# Patient Record
Sex: Female | Born: 1970 | Race: White | Hispanic: No | Marital: Single
Health system: Southern US, Community
[De-identification: ages and names within clinical notes are randomized; demographics above are authoritative.]

## PROBLEM LIST (undated history)

## (undated) DIAGNOSIS — I509 Heart failure, unspecified: Secondary | ICD-10-CM

## (undated) DIAGNOSIS — J45909 Unspecified asthma, uncomplicated: Secondary | ICD-10-CM

## (undated) DIAGNOSIS — I2699 Other pulmonary embolism without acute cor pulmonale: Secondary | ICD-10-CM

## (undated) DIAGNOSIS — I272 Pulmonary hypertension, unspecified: Secondary | ICD-10-CM

---

## 2021-07-31 ENCOUNTER — Inpatient Hospital Stay (HOSPITAL_COMMUNITY)
Admit: 2021-07-31 | Discharge: 2021-07-31 | Disposition: A | Discharge status: Home / Self Care | Payer: PRIVATE HEALTH INSURANCE | Attending: Internal Medicine | Admitting: Internal Medicine

## 2021-07-31 ENCOUNTER — Observation Stay
Admission: EM | Admit: 2021-07-31 | Discharge: 2021-08-01 | Disposition: A | Discharge status: Skilled Nursing Facility | Payer: PRIVATE HEALTH INSURANCE | Attending: Obstetrics and Gynecology | Admitting: Obstetrics and Gynecology

## 2021-07-31 ENCOUNTER — Other Ambulatory Visit: Payer: Self-pay

## 2021-07-31 ENCOUNTER — Inpatient Hospital Stay: Payer: PRIVATE HEALTH INSURANCE

## 2021-07-31 ENCOUNTER — Emergency Department: Payer: PRIVATE HEALTH INSURANCE

## 2021-07-31 ENCOUNTER — Encounter: Payer: Self-pay | Admitting: Emergency Medicine

## 2021-07-31 DIAGNOSIS — I2699 Other pulmonary embolism without acute cor pulmonale: Secondary | ICD-10-CM | POA: Diagnosis not present

## 2021-07-31 DIAGNOSIS — Z8673 Personal history of transient ischemic attack (TIA), and cerebral infarction without residual deficits: Secondary | ICD-10-CM

## 2021-07-31 DIAGNOSIS — R0602 Shortness of breath: Secondary | ICD-10-CM | POA: Diagnosis not present

## 2021-07-31 DIAGNOSIS — Z79899 Other long term (current) drug therapy: Secondary | ICD-10-CM | POA: Insufficient documentation

## 2021-07-31 DIAGNOSIS — Z86711 Personal history of pulmonary embolism: Secondary | ICD-10-CM | POA: Diagnosis not present

## 2021-07-31 DIAGNOSIS — I700 Atherosclerosis of aorta: Secondary | ICD-10-CM | POA: Insufficient documentation

## 2021-07-31 DIAGNOSIS — J810 Acute pulmonary edema: Secondary | ICD-10-CM | POA: Diagnosis not present

## 2021-07-31 DIAGNOSIS — F32A Depression, unspecified: Secondary | ICD-10-CM | POA: Diagnosis present

## 2021-07-31 DIAGNOSIS — I509 Heart failure, unspecified: Secondary | ICD-10-CM

## 2021-07-31 DIAGNOSIS — I5031 Acute diastolic (congestive) heart failure: Secondary | ICD-10-CM | POA: Insufficient documentation

## 2021-07-31 DIAGNOSIS — Z7982 Long term (current) use of aspirin: Secondary | ICD-10-CM | POA: Insufficient documentation

## 2021-07-31 DIAGNOSIS — J45901 Unspecified asthma with (acute) exacerbation: Secondary | ICD-10-CM | POA: Diagnosis not present

## 2021-07-31 DIAGNOSIS — Z20822 Contact with and (suspected) exposure to covid-19: Secondary | ICD-10-CM | POA: Diagnosis not present

## 2021-07-31 DIAGNOSIS — J9620 Acute and chronic respiratory failure, unspecified whether with hypoxia or hypercapnia: Secondary | ICD-10-CM | POA: Diagnosis not present

## 2021-07-31 DIAGNOSIS — I272 Pulmonary hypertension, unspecified: Secondary | ICD-10-CM | POA: Diagnosis present

## 2021-07-31 DIAGNOSIS — J9621 Acute and chronic respiratory failure with hypoxia: Principal | ICD-10-CM | POA: Insufficient documentation

## 2021-07-31 DIAGNOSIS — I6930 Unspecified sequelae of cerebral infarction: Secondary | ICD-10-CM

## 2021-07-31 HISTORY — DX: Other pulmonary embolism without acute cor pulmonale: I26.99

## 2021-07-31 HISTORY — DX: Morbid (severe) obesity due to excess calories: E66.01

## 2021-07-31 HISTORY — DX: Pulmonary hypertension, unspecified: I27.20

## 2021-07-31 LAB — BASIC METABOLIC PANEL
Anion gap: 7 (ref 5–15)
BUN: 21 mg/dL — ABNORMAL HIGH (ref 6–20)
CO2: 43 mmol/L — ABNORMAL HIGH (ref 22–32)
Calcium: 8.9 mg/dL (ref 8.9–10.3)
Chloride: 91 mmol/L — ABNORMAL LOW (ref 98–111)
Creatinine, Ser: <0.3 mg/dL — ABNORMAL LOW (ref 0.44–1.00)
Glucose, Bld: 100 mg/dL — ABNORMAL HIGH (ref 70–99)
Potassium: UNDETERMINED mmol/L (ref 3.5–5.1)
Sodium: 141 mmol/L (ref 135–145)

## 2021-07-31 LAB — CBC WITH DIFFERENTIAL/PLATELET
Abs Immature Granulocytes: 0.17 10*3/uL — ABNORMAL HIGH (ref 0.00–0.07)
Basophils Absolute: 0 10*3/uL (ref 0.0–0.1)
Basophils Relative: 1 %
Eosinophils Absolute: 0.1 10*3/uL (ref 0.0–0.5)
Eosinophils Relative: 1 %
HCT: 40.4 % (ref 36.0–46.0)
Hemoglobin: 11.5 g/dL — ABNORMAL LOW (ref 12.0–15.0)
Immature Granulocytes: 4 %
Lymphocytes Relative: 34 %
Lymphs Abs: 1.5 10*3/uL (ref 0.7–4.0)
MCH: 26.9 pg (ref 26.0–34.0)
MCHC: 28.5 g/dL — ABNORMAL LOW (ref 30.0–36.0)
MCV: 94.6 fL (ref 80.0–100.0)
Monocytes Absolute: 0.4 10*3/uL (ref 0.1–1.0)
Monocytes Relative: 9 %
Neutro Abs: 2.3 10*3/uL (ref 1.7–7.7)
Neutrophils Relative %: 51 %
Platelets: 121 10*3/uL — ABNORMAL LOW (ref 150–400)
RBC: 4.27 MIL/uL (ref 3.87–5.11)
RDW: 15.3 % (ref 11.5–15.5)
WBC: 4.5 10*3/uL (ref 4.0–10.5)
nRBC: 0 % (ref 0.0–0.2)

## 2021-07-31 LAB — ECHOCARDIOGRAM COMPLETE
AR max vel: 2.36 cm2
AV Area VTI: 2.62 cm2
AV Area mean vel: 2.5 cm2
AV Mean grad: 4 mmHg
AV Peak grad: 8.2 mmHg
Ao pk vel: 1.43 m/s
Area-P 1/2: 10.54 cm2
Height: 63 in
MV VTI: 2.8 cm2
S' Lateral: 3.5 cm
Weight: 3200 oz

## 2021-07-31 LAB — RESP PANEL BY RT-PCR (FLU A&B, COVID) ARPGX2
Influenza A by PCR: NEGATIVE
Influenza B by PCR: NEGATIVE
SARS Coronavirus 2 by RT PCR: NEGATIVE

## 2021-07-31 LAB — HEMOGLOBIN A1C
Hgb A1c MFr Bld: 4.8 % (ref 4.8–5.6)
Mean Plasma Glucose: 91.06 mg/dL

## 2021-07-31 LAB — TROPONIN I (HIGH SENSITIVITY)
Troponin I (High Sensitivity): 9 ng/L (ref <18)
Troponin I (High Sensitivity): 8 ng/L (ref <18)

## 2021-07-31 LAB — BRAIN NATRIURETIC PEPTIDE: B Natriuretic Peptide: 44.9 pg/mL (ref 0.0–100.0)

## 2021-07-31 LAB — MRSA NEXT GEN BY PCR, NASAL: MRSA by PCR Next Gen: DETECTED — AB

## 2021-07-31 LAB — VALPROIC ACID LEVEL: Valproic Acid Lvl: 67 ug/mL (ref 50.0–100.0)

## 2021-07-31 LAB — D-DIMER, QUANTITATIVE: D-Dimer, Quant: 0.28 ug/mL-FEU (ref 0.00–0.50)

## 2021-07-31 MED ORDER — ENOXAPARIN SODIUM 40 MG/0.4ML IJ SOSY: 40.0000 mg | PREFILLED_SYRINGE | INTRAMUSCULAR | Status: DC

## 2021-07-31 MED ORDER — ACETAMINOPHEN 650 MG RE SUPP: 650.0000 mg | Freq: Four times a day (QID) | RECTAL | Status: DC | PRN

## 2021-07-31 MED ORDER — FENTANYL CITRATE PF 50 MCG/ML IJ SOSY
50.0000 ug | PREFILLED_SYRINGE | Freq: Once | INTRAMUSCULAR | Status: AC
  Administered 2021-07-31: 50 ug via INTRAVENOUS
  Filled 2021-07-31: qty 1

## 2021-07-31 MED ORDER — ENOXAPARIN SODIUM 60 MG/0.6ML IJ SOSY
50.0000 mg | PREFILLED_SYRINGE | Freq: Two times a day (BID) | INTRAMUSCULAR | Status: DC
Start: 2021-07-31 — End: 2021-07-31

## 2021-07-31 MED ORDER — FUROSEMIDE 10 MG/ML IJ SOLN
40.0000 mg | Freq: Every day | INTRAMUSCULAR | Status: DC
  Administered 2021-08-01: 40 mg via INTRAVENOUS
  Filled 2021-07-31: qty 4

## 2021-07-31 MED ORDER — METHYLPREDNISOLONE SODIUM SUCC 125 MG IJ SOLR
125.0000 mg | Freq: Once | INTRAMUSCULAR | Status: AC
  Administered 2021-07-31: 125 mg via INTRAVENOUS
  Filled 2021-07-31: qty 2

## 2021-07-31 MED ORDER — NALOXONE HCL 2 MG/2ML IJ SOSY
0.4000 mg | PREFILLED_SYRINGE | Freq: Once | INTRAMUSCULAR | Status: DC
Start: 2021-07-31 — End: 2021-08-01

## 2021-07-31 MED ORDER — DOCUSATE SODIUM 100 MG PO CAPS
100.0000 mg | ORAL_CAPSULE | Freq: Every day | ORAL | Status: DC
Start: 2021-07-31 — End: 2021-08-01
  Administered 2021-07-31 – 2021-08-01 (×2): 100 mg via ORAL
  Filled 2021-07-31 (×2): qty 1

## 2021-07-31 MED ORDER — LIDOCAINE 5 % EX PTCH
1.0000 | MEDICATED_PATCH | CUTANEOUS | Status: DC
  Administered 2021-07-31: 1 via TRANSDERMAL
  Filled 2021-07-31 (×2): qty 1

## 2021-07-31 MED ORDER — RISPERIDONE 0.5 MG PO TABS
0.5000 mg | ORAL_TABLET | ORAL | Status: DC
  Administered 2021-07-31 – 2021-08-01 (×2): 0.5 mg via ORAL
  Filled 2021-07-31 (×2): qty 1

## 2021-07-31 MED ORDER — ONDANSETRON HCL 4 MG PO TABS: 4.0000 mg | ORAL_TABLET | Freq: Four times a day (QID) | ORAL | Status: DC | PRN

## 2021-07-31 MED ORDER — ATORVASTATIN CALCIUM 20 MG PO TABS
40.0000 mg | ORAL_TABLET | Freq: Every day | ORAL | Status: DC
Start: 2021-07-31 — End: 2021-08-01
  Administered 2021-07-31: 40 mg via ORAL
  Filled 2021-07-31: qty 2

## 2021-07-31 MED ORDER — MUPIROCIN 2 % EX OINT
1.0000 "application " | TOPICAL_OINTMENT | Freq: Two times a day (BID) | CUTANEOUS | Status: DC
  Administered 2021-07-31 – 2021-08-01 (×2): 1 via NASAL
  Filled 2021-07-31: qty 22

## 2021-07-31 MED ORDER — ENOXAPARIN SODIUM 60 MG/0.6ML IJ SOSY
60.0000 mg | PREFILLED_SYRINGE | Freq: Two times a day (BID) | INTRAMUSCULAR | Status: DC
  Administered 2021-07-31 – 2021-08-01 (×2): 60 mg via SUBCUTANEOUS
  Filled 2021-07-31: qty 0.6

## 2021-07-31 MED ORDER — POLYETHYLENE GLYCOL 3350 17 G PO PACK: 17.0000 g | PACK | Freq: Every day | ORAL | Status: DC | PRN

## 2021-07-31 MED ORDER — ATORVASTATIN CALCIUM 20 MG PO TABS
40.0000 mg | ORAL_TABLET | Freq: Every day | ORAL | Status: DC
  Administered 2021-07-31: 40 mg via ORAL
  Filled 2021-07-31: qty 2

## 2021-07-31 MED ORDER — CALCIUM CARBONATE ANTACID 500 MG PO CHEW
1.0000 | CHEWABLE_TABLET | ORAL | Status: DC
  Administered 2021-07-31 – 2021-08-01 (×2): 200 mg via ORAL
  Filled 2021-07-31 (×2): qty 1

## 2021-07-31 MED ORDER — FUROSEMIDE 10 MG/ML IJ SOLN
40.0000 mg | Freq: Once | INTRAMUSCULAR | Status: AC
Start: 2021-07-31 — End: 2021-07-31
  Administered 2021-07-31: 40 mg via INTRAVENOUS
  Filled 2021-07-31: qty 4

## 2021-07-31 MED ORDER — ONDANSETRON HCL 4 MG/2ML IJ SOLN
4.0000 mg | Freq: Four times a day (QID) | INTRAMUSCULAR | Status: DC | PRN
  Administered 2021-07-31: 4 mg via INTRAVENOUS
  Filled 2021-07-31: qty 2

## 2021-07-31 MED ORDER — CHLORHEXIDINE GLUCONATE CLOTH 2 % EX PADS
6.0000 | MEDICATED_PAD | Freq: Every day | CUTANEOUS | Status: DC
  Administered 2021-08-01: 6 via TOPICAL

## 2021-07-31 MED ORDER — IPRATROPIUM-ALBUTEROL 0.5-2.5 (3) MG/3ML IN SOLN: 3.0000 mL | Freq: Two times a day (BID) | RESPIRATORY_TRACT | Status: DC | PRN

## 2021-07-31 MED ORDER — PANTOPRAZOLE SODIUM 20 MG PO TBEC
20.0000 mg | DELAYED_RELEASE_TABLET | Freq: Every day | ORAL | Status: DC
  Administered 2021-07-31 – 2021-08-01 (×2): 20 mg via ORAL
  Filled 2021-07-31 (×2): qty 1

## 2021-07-31 MED ORDER — POLYVINYL ALCOHOL 1.4 % OP SOLN: 1.0000 [drp] | Freq: Two times a day (BID) | OPHTHALMIC | Status: DC

## 2021-07-31 MED ORDER — PREGABALIN 50 MG PO CAPS
50.0000 mg | ORAL_CAPSULE | ORAL | Status: DC
  Administered 2021-07-31 – 2021-08-01 (×2): 50 mg via ORAL
  Filled 2021-07-31 (×2): qty 1

## 2021-07-31 MED ORDER — TRAMADOL HCL 50 MG PO TABS
50.0000 mg | ORAL_TABLET | Freq: Four times a day (QID) | ORAL | Status: DC | PRN
  Administered 2021-07-31 – 2021-08-01 (×2): 50 mg via ORAL
  Filled 2021-07-31 (×2): qty 1

## 2021-07-31 MED ORDER — SERTRALINE HCL 50 MG PO TABS
100.0000 mg | ORAL_TABLET | Freq: Every day | ORAL | Status: DC
  Administered 2021-07-31 – 2021-08-01 (×2): 100 mg via ORAL
  Filled 2021-07-31 (×2): qty 2

## 2021-07-31 MED ORDER — DIVALPROEX SODIUM 500 MG PO DR TAB
500.0000 mg | DELAYED_RELEASE_TABLET | ORAL | Status: DC
  Administered 2021-07-31 – 2021-08-01 (×2): 500 mg via ORAL
  Filled 2021-07-31 (×2): qty 1

## 2021-07-31 MED ORDER — ASPIRIN 81 MG PO CHEW: 81.0000 mg | CHEWABLE_TABLET | Freq: Every day | ORAL | Status: DC

## 2021-07-31 MED ORDER — ASPIRIN 81 MG PO CHEW
81.0000 mg | CHEWABLE_TABLET | Freq: Every day | ORAL | Status: DC
  Administered 2021-07-31 – 2021-08-01 (×2): 81 mg via ORAL
  Filled 2021-07-31 (×2): qty 1

## 2021-07-31 MED ORDER — DIVALPROEX SODIUM 125 MG PO CSDR
250.0000 mg | DELAYED_RELEASE_CAPSULE | Freq: Every day | ORAL | Status: DC
  Administered 2021-07-31: 250 mg via ORAL
  Filled 2021-07-31 (×2): qty 2

## 2021-07-31 MED ORDER — PERFLUTREN LIPID MICROSPHERE
1.0000 mL | INTRAVENOUS | Status: AC | PRN
  Administered 2021-07-31: 2 mL via INTRAVENOUS
  Filled 2021-07-31: qty 10

## 2021-07-31 MED ORDER — ACETAMINOPHEN 325 MG PO TABS: 650.0000 mg | ORAL_TABLET | Freq: Four times a day (QID) | ORAL | Status: DC | PRN

## 2021-07-31 MED ORDER — ALBUTEROL SULFATE (2.5 MG/3ML) 0.083% IN NEBU
5.0000 mg | INHALATION_SOLUTION | Freq: Once | RESPIRATORY_TRACT | Status: AC
  Administered 2021-07-31: 5 mg via RESPIRATORY_TRACT
  Filled 2021-07-31: qty 6

## 2021-07-31 NOTE — Consult Note (Signed)
ANTICOAGULATION CONSULT NOTE - Initial Consult ? ?Pharmacy Consult for enoxaparin ?Indication:  Hx of PE- antiphospholipid syndrome ? ?Allergies  ?Allergen Reactions  ? Sulphamethoxydiazine Diarrhea  ? Tetracyclines & Related Diarrhea  ? ? ?Patient Measurements: ?Height: 5\' 3"  (160 cm) ?Weight: 90.7 kg (200 lb) ?IBW/kg (Calculated) : 52.4 ? ? ?Vital Signs: ?Temp: 97.7 ?F (36.5 ?C) (04/26 0525) ?Temp Source: Oral (04/26 0525) ?BP: 104/77 (04/26 1300) ?Pulse Rate: 107 (04/26 1224) ? ?Labs: ?Recent Labs  ?  07/31/21 ?08/02/21 07/31/21 ?0730  ?HGB 11.5*  --   ?HCT 40.4  --   ?PLT 121*  --   ?CREATININE <0.30*  --   ?TROPONINIHS 9 8  ? ? ?CrCl cannot be calculated (This lab value cannot be used to calculate CrCl because it is not a number: <0.30). ? ? ?Medical History: ?Past Medical History:  ?Diagnosis Date  ? Morbid obesity (HCC)   ? Pulmonary emboli (HCC)   ? Pulmonary hypertension (HCC)   ? ? ?Medications:  ?PTA: Enoxaparin regimen 60 mg Q12H SubQ: last dose 4/25 PM ? ?Assessment: ?51 year old female with PMH significant for morbid obesity (BMI 35.4), pulmonary embolism on chronic anticoagulation therapy with Lovenox 60 mg twice daily PTA presented to [facility] 07/31/21 with SOB. Pharmacy consulted to manage therapeutic enoxaparin while admitted. ? ?Attempted to call SNF to verify patient enoxaparin regimen as 60 mg Q12H is less than typical weight based dosing. Unable to determine if patient had previous levels outpatient and had enoxaparin adjusted accordingly. Assuming no missed doses PTA, patient should be at steady state. ? ?Goal of Therapy:  ?Anti-Xa level 0.6-1 units/ml 4hrs after LMWH dose given ?Monitor platelets by anticoagulation protocol: Yes ?  ?Plan:  ?Continue home regimen of 60 mg SQ Q12H, assuming no missed doses patient should already be at steady state.  ?Will check peak Anti-Xa level tomorrow following AM dose and adjust as necessary  ?CBC at least every 72 hours ? ?08/02/21, PharmD, BCPS ?Clinical  Pharmacist   ?07/31/2021,4:05 PM ? ? ?

## 2021-07-31 NOTE — Assessment & Plan Note (Signed)
Treatment as outlined in 2 

## 2021-07-31 NOTE — Progress Notes (Signed)
Admission profile udpated ?

## 2021-07-31 NOTE — ED Notes (Signed)
ED Provider at bedside. 

## 2021-07-31 NOTE — Assessment & Plan Note (Addendum)
Patient presents for evaluation of worsening shortness of breath from her baseline. ?She has chronic respiratory failure and usually wears 4 L of oxygen.  She is currently on high flow nasal cannula ?Worsening respiratory status this to be secondary to pulmonary venous congestion ?We will attempt to wean patient down to her baseline home oxygen once acute illness improves. ?

## 2021-07-31 NOTE — H&P (Addendum)
?History and Physical  ? ? ?Patient: Sharon Black ZLD:[id number] DOB: 07/02/1970 ?DOA: 07/31/2021 ?DOS: the patient was seen and examined on 07/31/2021 ?PCP: Sherol Dade, DO  ?Patient coming from: Home ? ?Chief Complaint:  ?Chief Complaint  ?Patient presents with  ? Shortness of Breath  ? ?HPI: Sharon Black is a 51 y.o. female with medical history significant for morbid obesity, history of CVA with left-sided hemiparesis, chronic respiratory failure on 4 L of oxygen, history of pulmonary embolism on chronic anticoagulation therapy with Lovenox 50 mg twice daily and pulmonary hypertension who was sent to the ER from [facility] for evaluation of shortness of breath. ?At baseline patient wears 4 L of oxygen and per nursing home staff she had episodes of hypoxia with pulse oximetry dropping in the low 80s despite her being on her regular oxygen supplementation. ?She denies having any fever or chills, she denies having any chest pain, no cough, no abdominal pain, no changes in her bowel habits, no dizziness, no lightheadedness, no new focal deficits or blurred vision. ?She has chronic left leg pain which is unchanged. ?Patient received a dose of IV Lasix in the ER as well as a nebulizer treatment.  She was given a dose of fentanyl for her chronic left leg pain and had worsening hypoxia that improved with repositioning. ? ?Review of Systems: As mentioned in the history of present illness. All other systems reviewed and are negative. ?Past Medical History:  ?Diagnosis Date  ? Morbid obesity (HCC)   ? Pulmonary emboli (HCC)   ? Pulmonary hypertension (HCC)   ? ?History reviewed. No pertinent surgical history. ?Social History:  has no history on file for tobacco use, alcohol use, and drug use. ? ?Allergies  ?Allergen Reactions  ? Sulphamethoxydiazine Diarrhea  ? Tetracyclines & Related Diarrhea  ? ? ?History reviewed. No pertinent family history. ? ?Prior to Admission medications   ?Not on File   ? ? ?Physical Exam: ?Physical Exam ?Vitals and nursing note reviewed.  ?Constitutional:   ?   Appearance: She is obese.  ?   Comments: Chronically ill-appearing.  Dry mucous membranes, poor oral hygiene  ?HENT:  ?   Head: Normocephalic and atraumatic.  ?   Mouth/Throat:  ?   Comments: Dry mucous membranes ?Eyes:  ?   Extraocular Movements: Extraocular movements intact.  ?   Pupils: Pupils are equal, round, and reactive to light.  ?Cardiovascular:  ?   Rate and Rhythm: Normal rate and regular rhythm.  ?Pulmonary:  ?   Effort: Pulmonary effort is normal.  ?   Breath sounds: Normal breath sounds.  ?Abdominal:  ?   General: Bowel sounds are normal.  ?   Palpations: Abdomen is soft.  ?   Comments: Central adiposity  ?Musculoskeletal:  ?   Cervical back: Normal range of motion and neck supple.  ?   Right lower leg: Edema present.  ?   Comments: Decreased range of motion left leg.  Contracted  ?Skin: ?   General: Skin is warm and dry.  ?Neurological:  ?   Mental Status: She is alert.  ?   Comments: Left-sided hemiparesis from prior stroke  ?Psychiatric:     ?   Mood and Affect: Mood normal.     ?   Behavior: Behavior normal.  ? ? ? ? ?Data Reviewed: ?Data Reviewed: ?Relevant notes from primary care and specialist visits, past discharge summaries as available in EHR, including Care Everywhere. ?Prior diagnostic testing as pertinent to current admission  diagnoses ?Updated medications and problem lists for reconciliation ?ED course, including vitals, labs, imaging, treatment and response to treatment ?Triage notes, nursing and pharmacy notes and ED provider's notes ?Notable results as noted in HPI ?Labs reviewed.  Sodium 141, chloride 91, bicarb 23, glucose 100, BUN 21, creatinine 0.30 calcium 8.9, BNP 44.9, D-dimer 0.28, white count 4.5, hemoglobin 11.5, hematocrit 40.4, RDW 15.3, platelet count 121 ?Respiratory viral panel is negative ?Chest x-ray reviewed by me shows low lung volumes with imaging findings concerning for  congestive ?heart failure. Severe elevation of the right hemidiaphragm. ?CT scan of the chest without contrast shows no evidence of pneumonia. Suspect pulmonary venous congestion, primarily at the apices. Concurrent hypoventilation with areas of air trapping. Cardiomegaly. ?4. Pulmonary artery enlargement suggests pulmonary arterial hypertension. ?Twelve-lead EKG reviewed by me shows sinus tachycardia with low voltage QRS. ?There are no new results to review at this time. ? ?Assessment and Plan: ?* Acute on chronic respiratory failure (HCC) ?Patient presents for evaluation of worsening shortness of breath from her baseline. ?She has chronic respiratory failure and usually wears 4 L of oxygen.  She is currently on high flow nasal cannula ?Worsening respiratory status this to be secondary to pulmonary venous congestion ?We will attempt to wean patient down to her baseline home oxygen once acute illness improves. ? ?Acute CHF (congestive heart failure) (HCC) ?Unclear etiology ?Most likely diastolic dysfunction as patient has a known history of pulmonary hypertension ?Continue Lasix 40 mg IV daily ?Patient not on a beta-blocker or ACE due to relative hypotension ?Obtain 2D echocardiogram to assess LVEF ?We will consult cardiology ? ?Pulmonary hypertension (HCC) ?Treatment as outlined in 2 ? ?Pulmonary emboli (HCC) ?Patient has a history of pulmonary embolism ?Continue Lovenox 60 mg subcu every 12 hours ? ?Morbid obesity (HCC) ?BMI 35.43 kg/m2 ?Complicates prognosis and care ? ?History of CVA with residual deficit ?Patient has a history of CVA with left-sided hemiparesis ?At baseline she is bed bound ?She will need frequent turning to prevent development of pressure ulcers ?Continue aspirin and atorvastatin ? ?Depression ??? Bipolar Disorder ?Stable ?Continue sertraline, Depakote and risperidone ? ? ? ? ? Advance Care Planning:   Code Status: Full Code  ? ?Consults: Cardiology ? ?Family Communication: Greater than 50% of  time was spent discussing patient's condition and plan of care with her at the bedside.  All questions and concerns have been addressed.  She verbalizes understanding and agrees with the plan. ? ?Severity of Illness: ?The appropriate patient status for this patient is INPATIENT. Inpatient status is judged to be reasonable and necessary in order to provide the required intensity of service to ensure the patient's safety. The patient's presenting symptoms, physical exam findings, and initial radiographic and laboratory data in the context of their chronic comorbidities is felt to place them at high risk for further clinical deterioration. Furthermore, it is not anticipated that the patient will be medically stable for discharge from the hospital within 2 midnights of admission.  ? ?* I certify that at the point of admission it is my clinical judgment that the patient will require inpatient hospital care spanning beyond 2 midnights from the point of admission due to high intensity of service, high risk for further deterioration and high frequency of surveillance required.* ? ?Author: ?Lucile Shutters, MD ?07/31/2021 1:54 PM ? ?For on call review [email or web address].  ?

## 2021-07-31 NOTE — Progress Notes (Addendum)
PT Cancellation Note ? ?Patient Details ?Name: Sharon Black ?MRN: [id number] ?DOB: 08/28/1970 ? ? ?Cancelled Treatment:    Reason Eval/Treat Not Completed: Other (comment)Consult received and chart reviewed. Per discussion with OT, pt is currently bedbound at baseline and reports she does not want/need additional PT at this time. Will cancel current order. ? ? ?Shloma Roggenkamp ?07/31/2021, 1:32 PM ?Elizabeth Palau, PT, DPT, GCS ?[phone] ? ?

## 2021-07-31 NOTE — Assessment & Plan Note (Signed)
BMI 35.43 kg/m2 ?Complicates prognosis and care ?

## 2021-07-31 NOTE — Evaluation (Signed)
Occupational Therapy Evaluation ?Patient Details ?Name: Sharon Black ?MRN: [id number] ?DOB: Dec 17, 1970 ?Today's Date: 07/31/2021 ? ? ?History of Present Illness Sharon Black is a 51 y.o. female with medical history significant for morbid obesity, history of CVA with left-sided hemiparesis, chronic respiratory failure on 4 L of oxygen, history of pulmonary embolism on chronic anticoagulation therapy and pulmonary hypertension who was sent to the ER from [facility] for evaluation of shortness of breath.  ? ?Clinical Impression ?  ?Ms Creech was seen for OT evaluation this date, pt is very pleasant and answers all questions appropriately. Prior to hospital admission, pt was bed bound baseline and is resident at [facility]. Pt reports baseline can adjust trunk with use of trapeze but requires +2 all bed mobility. Reprts she does not hoyer OOB at baseline 2/2 not tolerating sitting. LUE/LLE flaccid at baseline.  ? ?Pt requires TOTAL A x2 L rolling at bed level for periaccess and doff brief. Washcloth placed in L hand to prevent skin breakdown. Pt reports at baseline functional independence and good understanding of positioning for skin prevention. Will sign off, pt aware and agreeable. Recommend LTC at d/c.    ? ?Recommendations for follow up therapy are one component of a multi-disciplinary discharge planning process, led by the attending physician.  Recommendations may be updated based on patient status, additional functional criteria and insurance authorization.  ? ?Follow Up Recommendations ? Long-term institutional care without follow-up therapy  ?  ?Assistance Recommended at Discharge Frequent or constant Supervision/Assistance  ?Patient can return home with the following Other (comment) (+3 people or lift equipment) ? ?  ?Functional Status Assessment ? Patient has not had a recent decline in their functional status  ?Equipment Recommendations ? Hospital bed  ?  ?Recommendations for Other Services    ? ? ?  ?Precautions / Restrictions Precautions ?Precautions: Fall ?Restrictions ?Weight Bearing Restrictions: No  ? ?  ? ?Mobility Bed Mobility ?Overal bed mobility: Needs Assistance ?Bed Mobility: Rolling ?Rolling: Total assist, +2 for physical assistance ?  ?  ?  ?  ?General bed mobility comments: TOTAL A x2 L rolling - limited by size of stretcher ?  ? ?Transfers ?  ?  ?  ?  ?  ?  ?  ?  ?  ?General transfer comment: bed bound baseline ?  ? ?  ?   ? ?ADL either performed or assessed with clinical judgement  ? ?ADL Overall ADL's : Needs assistance/impaired ?  ?  ?  ?  ?  ?  ?  ?  ?  ?  ?  ?  ?  ?  ?  ?  ?  ?  ?  ?General ADL Comments: TOTAL A x2 bed level toileting and doff brief. SETUP self-drinking at bed level.  ? ? ? ? ?Pertinent Vitals/Pain Pain Assessment ?Pain Assessment: No/denies pain  ? ? ? ?Hand Dominance Right ?  ?Extremity/Trunk Assessment Upper Extremity Assessment ?Upper Extremity Assessment: LUE deficits/detail ?LUE Deficits / Details: LUE flaccid baseline ?  ?Lower Extremity Assessment ?Lower Extremity Assessment: LLE deficits/detail ?LLE Deficits / Details: near flaccid baseline - reports hx of patellar fx ?  ?  ?  ?Communication Communication ?Communication: No difficulties ?  ?Cognition Arousal/Alertness: Awake/alert ?Behavior During Therapy: [facility] for tasks assessed/performed ?Overall Cognitive Status: Within Functional Limits for tasks assessed ?  ?  ?  ?  ?  ?  ?  ?  ?  ?  ?  ?  ?  ?  ?  ?  ?  ?  ?  ?   ?   ?   ? ? ?  Home Living Family/patient expects to be discharged to:: Skilled nursing facility ?  ?  ?  ?  ?  ?  ?  ?  ?  ?  ?  ?  ?  ?  ?  ?  ?Additional Comments: [facility] ?  ? ?  ?Prior Functioning/Environment Prior Level of Function : Needs assist ?  ?  ?  ?  ?  ?  ?Mobility Comments: adjust trunk with use of trapeze but requrires +2 bed mobility. Reprts she does not hoyer OOB at baseline 2/2 not tolerating sitting ?ADLs Comments: TOTAL A bed pan use. SETUP self-feeding and face  washing at baseline ?  ? ?  ?  ?OT Problem List: Cardiopulmonary status limiting activity ?  ?   ?   ?OT Goals(Current goals can be found in the care plan section) Acute Rehab OT Goals ?Patient Stated Goal: to not return to her facility ?OT Goal Formulation: With patient ?Time For Goal Achievement: 08/14/21 ?Potential to Achieve Goals: Fair  ? ?AM-PAC OT "6 Clicks" Daily Activity     ?Outcome Measure Help from another person eating meals?: A Little ?Help from another person taking care of personal grooming?: A Lot ?Help from another person toileting, which includes using toliet, bedpan, or urinal?: Total ?Help from another person bathing (including washing, rinsing, drying)?: Total ?Help from another person to put on and taking off regular upper body clothing?: A Lot ?Help from another person to put on and taking off regular lower body clothing?: Total ?6 Click Score: 10 ?  ?End of Session Nurse Communication: Mobility status ? ?Activity Tolerance: Patient tolerated treatment well ?Patient left: in bed;with call bell/phone within reach ? ?OT Visit Diagnosis: Muscle weakness (generalized) (M62.81)  ?              ?Time: 6759-1638 ?OT Time Calculation (min): 12 min ?Charges:  OT General Charges ?$OT Visit: 1 Visit ?OT Evaluation ?$OT Eval Low Complexity: 1 Low ? ?Kathie Dike, M.S. OTR/L  ?07/31/21, 1:44 PM  ?ascom [phone] ? ?

## 2021-07-31 NOTE — Assessment & Plan Note (Signed)
Most likely secondary to acute CHF of unknown etiology ?Patient has a history of pulmonary hypertension ?She is currently on high flow oxygen but at baseline wears 4 L of oxygen ?We will attempt to wean down to her baseline oxygen requirement as tolerated ?

## 2021-07-31 NOTE — ED Triage Notes (Addendum)
Pt here from [facility] for c/o SOB. Per EMS  Pt has been having problems since yesterday and pt wanting to be sent to hospital but facility decided to keep her until this morning. Per EMS , she will file APS report.  Duoneb treatment given PTA . Pt presents to ED AAO, respi even-unlabored. Pt reports PMHX CVA w/ lt sided deficits. Bed-bound patient. ?

## 2021-07-31 NOTE — Assessment & Plan Note (Addendum)
Patient has a history of pulmonary embolism ?Continue Lovenox 60 mg subcu every 12 hours ?

## 2021-07-31 NOTE — Progress Notes (Signed)
*  PRELIMINARY RESULTS* ?Echocardiogram ?2D Echocardiogram has been performed. ? ?Sharon Black ?07/31/2021, 1:15 PM ?

## 2021-07-31 NOTE — Assessment & Plan Note (Signed)
??   Bipolar Disorder ?Stable ?Continue sertraline, Depakote and risperidone ?

## 2021-07-31 NOTE — Consult Note (Signed)
?Cardiology Consultation:  ? ?Patient ID: Sharon HousemanMelissa Black ?MRN: [id number]; DOB: [birth year] ? ?Admit date: 07/31/2021 ?Date of Consult: 07/31/2021 ? ?PCP:  Sherol DadeSimpson-Tarokh, [name], DO ?  ?[facility] Providers ?Cardiologist:  New ? ?Patient Profile:  ? ?Sharon Black is a 51 y.o. female with a hx of morbid obesity, h/o CVA with left sided hemiparesis, bedbound, chronic respiratory failure on L O2, h/o pulmonary embolism on chronic anticoagulation therapy with Lovenox BID, pulmonary HTN who is being seen 07/31/2021 for the evaluation of Acute CHF at the request of Dr. Joylene IgoAgbata. ? ?History of Present Illness:  ? ?[name] was previously followed by cardiologist Dr. Jory Eeed Frank in [place]. Also has pulmonologist in [place] as well. She reports she started seeing cardiology back in 2011 for pulmonary HTN. She denies h/o CAD, MI or stent although has a scar as if she had prior CABG. She denies h/o CHF. Reports occasional elevated heart rates with associated chest pressure. Family history positive for CAD/CABG in father and mother with Mi in her 5250s (passes away). No h/o ILD or tobacco use. No known h/o OSA. Says parents also have Pulmonary HTN. The patient lived in [place] until 2021, at which time she was discharged to a nursing home in [place]. She still travels to [place] to see cardiologist, may want to establish locally. She denies every having a cardiac catheterization. Says she has had multiple echocardiograms. Reports PTA Aspirin 81mg  and a statin.  ? ?The patient presented to the ER for preogressive SOB. Says she remember going to bed last night. Nurse at the nursing home apparently found the patient unresponsive and EMS was called. Reported patient was needing extra O2. No chest pain reported by the patient.  ? ?In the ER BP 104/82, pulse 107bpm, afebrile, O2 up to 6L. Labs showed Hgb 11.5, plt 121K, D-dimer negative. BNP 44.9 .BMET showed chl 91, CO2 43, Scr<0.3, BUN 21. Hs trop 9>8. Respiratory  panel negative. CXR with low lung volumes and possible CHF, elevation the right hemidiaphragm. CT chest showed motion degraded exam, no PNA, possible pulmonary venous congestion, hypoventilation, pulmonary arterial HTN, aortic atherosclerosis. She was given IV lasix and admitted for further work-up.  ? ?Past Medical History:  ?Diagnosis Date  ? Morbid obesity (HCC)   ? Pulmonary emboli (HCC)   ? Pulmonary hypertension (HCC)   ? ? ?History reviewed. No pertinent surgical history.  ? ?Home Medications:  ?Prior to Admission medications   ?Medication Sig Start Date End Date Taking? Authorizing Provider  ?ASPIRIN LOW DOSE 81 MG chewable tablet Chew 81 mg by mouth daily. 07/11/21  Yes [provider]  ?docusate sodium (COLACE) 100 MG capsule Take 100 mg by mouth daily. 07/11/21  Yes [provider]  ?enoxaparin (LOVENOX) 60 MG/0.6ML injection Inject 60 mg into the skin in the morning and at bedtime. 07/24/21  Yes [provider]  ?fexofenadine (ALLEGRA) 60 MG tablet Take 60 mg by mouth daily. 07/11/21  Yes [provider]  ?pantoprazole (PROTONIX) 20 MG tablet Take 20 mg by mouth daily. 07/25/21  Yes [provider]  ?sertraline (ZOLOFT) 100 MG tablet Take 100 mg by mouth in the morning. 07/25/21  Yes [provider]  ? ? ?Inpatient Medications: ?Scheduled Meds: ? [START ON 08/01/2021] furosemide  40 mg Intravenous Daily  ? naLOXone (NARCAN)  injection  0.4 mg Intravenous Once  ? ?Continuous Infusions: ? ?PRN Meds: ?acetaminophen **OR** acetaminophen, ondansetron **OR** ondansetron (ZOFRAN) IV ? ?Allergies:    ?Allergies  ?Allergen Reactions  ?  Sulphamethoxydiazine Diarrhea  ? Tetracyclines & Related Diarrhea  ? ? ?Social History:   ?Social History  ? ?Socioeconomic History  ? Marital status: Single  ?  Spouse name: Not on file  ? Number of children: Not on file  ? Years of education: Not on file  ? Highest education level: Not on file  ?Occupational History  ? Not on file   ?Tobacco Use  ? Smoking status: Not on file  ? Smokeless tobacco: Not on file  ?Substance and Sexual Activity  ? Alcohol use: Not on file  ? Drug use: Not on file  ? Sexual activity: Not on file  ?Other Topics Concern  ? Not on file  ?Social History Narrative  ? Not on file  ? ?Social Determinants of Health  ? ?Financial Resource Strain: Not on file  ?Food Insecurity: Not on file  ?Transportation Needs: Not on file  ?Physical Activity: Not on file  ?Stress: Not on file  ?Social Connections: Not on file  ?Intimate Partner Violence: Not on file  ?  ?Family History:   ?History reviewed. No pertinent family history.  ? ?ROS:  ?Please see the history of present illness.  ? ?All other ROS reviewed and negative.    ? ?Physical Exam/Data:  ? ?Vitals:  ? 07/31/21 0630 07/31/21 0700 07/31/21 0800 07/31/21 1000  ?BP: 94/75 106/83 106/79 111/76  ?Pulse: (!) 108 (!) 103 (!) 106   ?Resp: 19 (!) 23 19 (!) 22  ?Temp:      ?TempSrc:      ?SpO2: 91% 100%    ?Weight:      ?Height:      ? ?No intake or output data in the 24 hours ending 07/31/21 1032 ? ?  07/31/2021  ?  5:20 AM  ?Last 3 Weights  ?Weight (lbs) 200 lb  ?Weight (kg) 90.719 kg  ?   ?Body mass index is 35.43 kg/m?.  ?General:  morbidly obese female ?HEENT: normal ?Neck: no JVD ?Vascular: No carotid bruits; Distal pulses 2+ bilaterally ?Cardiac:  normal S1, S2; RRR; no murmur  ?Lungs:  diffusely diminished  ?Abd: soft, nontender, no hepatomegaly  ?Ext: no edema ?Musculoskeletal:  No deformities, BUE and BLE strength normal and equal ?Skin: warm and dry  ?Neuro:  CNs 2-12 intact, no focal abnormalities noted ?Psych:  Normal affect  ? ?EKG:  The EKG was personally reviewed and demonstrates:  ST 103bpm, nonspecific T wave changes ?Telemetry:  Telemetry was personally reviewed and demonstrates:  SR/ST HR around 100, PVCs ? ?Relevant CV Studies: ? ?Echo ordered ? ?Laboratory Data: ? ?High Sensitivity Troponin:   ?Recent Labs  ?Lab 07/31/21 ?9983 07/31/21 ?0730  ?TROPONINIHS 9 8   ?   ?Chemistry ?Recent Labs  ?Lab 07/31/21 ?0540  ?NA 141  ?K QUANTITY NOT SUFFICIENT, UNABLE TO PERFORM TEST  ?CL 91*  ?CO2 43*  ?GLUCOSE 100*  ?BUN 21*  ?CREATININE <0.30*  ?CALCIUM 8.9  ?GFRNONAA NOT CALCULATED  ?ANIONGAP 7  ?  ?No results for input(s): PROT, ALBUMIN, AST, ALT, ALKPHOS, BILITOT in the last 168 hours. ?Lipids No results for input(s): CHOL, TRIG, HDL, LABVLDL, LDLCALC, CHOLHDL in the last 168 hours.  ?Hematology ?Recent Labs  ?Lab 07/31/21 ?0540  ?WBC 4.5  ?RBC 4.27  ?HGB 11.5*  ?HCT 40.4  ?MCV 94.6  ?MCH 26.9  ?MCHC 28.5*  ?RDW 15.3  ?PLT 121*  ? ?Thyroid No results for input(s): TSH, FREET4 in the last 168 hours.  ?BNP ?Recent Labs  ?Lab 07/31/21 ?0540  ?  BNP 44.9  ?  ?DDimer  ?Recent Labs  ?Lab 07/31/21 ?0540  ?DDIMER 0.28  ? ? ? ?Radiology/Studies:  ?CT CHEST WO CONTRAST ? ?Result Date: 07/31/2021 ?CLINICAL DATA:  Shortness of breath.  CVA.  Bed-bound patient. EXAM: CT CHEST WITHOUT CONTRAST TECHNIQUE: Multidetector CT imaging of the chest was performed following the standard protocol without IV contrast. RADIATION DOSE REDUCTION: This exam was performed according to the departmental dose-optimization program which includes automated exposure control, adjustment of the mA and/or kV according to patient size and/or use of iterative reconstruction technique. COMPARISON:  Chest radiograph of earlier today FINDINGS: Cardiovascular: Mild motion degradation, most significant in the lung bases. Median sternotomy for prior CABG. Aortic atherosclerosis. Tortuous thoracic aorta. Upper normal ascending aortic caliber including at 3.9 cm on coronal image 70. Moderate cardiomegaly, without pericardial effusion. Pulmonary artery enlargement, outflow tract 3.3 cm Mediastinum/Nodes: No mediastinal or definite hilar adenopathy, given limitations of unenhanced CT. Lungs/Pleura: No pleural fluid. Hypoventilation, resulting in areas of mosaic attenuation likely related to air trapping. Somewhat more focal  ground-glass in the lung apices with mild septal thickening at the right apex, including on 32/4. No lobar consolidation. Upper Abdomen: Normal imaged portions of the liver, spleen, stomach, pancreas, gallbladder, adr

## 2021-07-31 NOTE — ED Notes (Addendum)
Writer spoke with [facility] staff (Angie, LPN) who reports that pts oxygen dropped to 82% was given 1 breathing treatment with improvement but then oxygen dropped again into 80's, prompting EMS call. Pt wears 3L oxygen chronically. No report made prior to this morning by pt that she was having any difficulty with her breathing until this AM.  ? ?Pt has been at facility since February 2021 due to CVA and respiratory failure with Hypoxia.  Pt also has psychotic disorder (unknown by staff.) Pt is bed bound with left sided deficits from CVA and regularly refuses care by the staff.  ?

## 2021-07-31 NOTE — Assessment & Plan Note (Addendum)
Patient has a history of CVA with left-sided hemiparesis ?At baseline she is bed bound ?She will need frequent turning to prevent development of pressure ulcers ?Continue aspirin and atorvastatin ?

## 2021-07-31 NOTE — ED Provider Notes (Signed)
? ?[facility] ?Provider Note ? ? ? Event Date/Time  ? First MD Initiated Contact with Patient 07/31/21 [phone]   ?  (approximate) ? ? ?History  ? ?Shortness of Breath ? ? ?HPI ? ?Emmaly Leech is a 51 y.o. female with history of antiphospholipid syndrome, hyperlipidemia, psychotic dorsal order, CVA, pulmonary hypertension on chronic 4 L nasal cannula, previous pulmonary embolus on 50 mg Lovenox twice daily, morbid obesity who presents to the emergency department EMS for several days of shortness of breath.  Patient lives in a nursing facility.  Denies any chest pain, fevers, cough.  Has chronic leg and back pain which is unchanged.  Reports compliance with her Lovenox. ? ?She denies to me any history of asthma or COPD but has asthma listed as one of her diagnoses from paperwork from [facility].  Given DuoNeb in route with EMS. ? ? ?History provided by patient and EMS. ? ? ? ?Past Medical History:  ?Diagnosis Date  ? Morbid obesity (HCC)   ? Pulmonary emboli (HCC)   ? Pulmonary hypertension (HCC)   ? ? ?History reviewed. No pertinent surgical history. ? ?MEDICATIONS:  ?Prior to Admission medications   ?Not on File  ? ? ?Physical Exam  ? ?Triage Vital Signs: ?ED Triage Vitals  ?Enc Vitals Group  ?   BP 07/31/21 0525 104/82  ?   Pulse Rate 07/31/21 0525 (!) 107  ?   Resp 07/31/21 0525 18  ?   Temp 07/31/21 0525 97.7 ?F (36.5 ?C)  ?   Temp Source 07/31/21 0525 Oral  ?   SpO2 07/31/21 0525 99 %  ?   Weight 07/31/21 0520 200 lb (90.7 kg)  ?   Height 07/31/21 0520 5\' 3"  (1.6 m)  ?   Head Circumference --   ?   Peak Flow --   ?   Pain Score --   ?   Pain Loc --   ?   Pain Edu? --   ?   Excl. in GC? --   ? ? ?Most recent vital signs: ?Vitals:  ? 07/31/21 0630 07/31/21 0700  ?BP: 94/75 106/83  ?Pulse: (!) 108 (!) 103  ?Resp: 19 (!) 23  ?Temp:    ?SpO2: 91% 100%  ? ? ?CONSTITUTIONAL: Alert and oriented and responds appropriately to questions.  Chronically ill-appearing, morbidly obese ?HEAD:  Normocephalic, atraumatic ?EYES: Conjunctivae clear, pupils appear equal, sclera nonicteric ?ENT: normal nose; moist mucous membranes ?NECK: Supple, normal ROM ?CARD: Regular and minimally tachycardic; S1 and S2 appreciated; no murmurs, no clicks, no rubs, no gallops ?RESP: Appears dyspneic and is slightly tachypneic.  Difficult to hear breath sounds due to body habitus but breath sounds clear and equal bilaterally; no wheezes, no rhonchi, no rales, no hypoxia or respiratory distress, speaking full sentences ?ABD/GI: Normal bowel sounds; non-distended; soft, non-tender, no rebound, no guarding, no peritoneal signs ?BACK: The back appears normal ?EXT: Normal ROM in all joints; no deformity noted, no edema; no cyanosis, no calf tenderness ?SKIN: Normal color for age and race; warm; no rash on exposed skin ?NEURO: Left-sided hemiplegia, normal speech, no facial asymmetry ?PSYCH: The patient's mood and manner are appropriate. ? ? ?ED Results / Procedures / Treatments  ? ?LABS: ?(all labs ordered are listed, but only abnormal results are displayed) ?Labs Reviewed  ?CBC WITH DIFFERENTIAL/PLATELET - Abnormal; Notable for the following components:  ?    Result Value  ? Hemoglobin 11.5 (*)   ? MCHC 28.5 (*)   ?  Platelets 121 (*)   ? Abs Immature Granulocytes 0.17 (*)   ? All other components within normal limits  ?RESP PANEL BY RT-PCR (FLU A&B, COVID) ARPGX2  ?D-DIMER, QUANTITATIVE  ?BRAIN NATRIURETIC PEPTIDE  ?VALPROIC ACID LEVEL  ?BASIC METABOLIC PANEL  ?TROPONIN I (HIGH SENSITIVITY)  ?TROPONIN I (HIGH SENSITIVITY)  ? ? ? ?EKG: ? EKG Interpretation ? ?Date/Time:  Wednesday July 31 2021 [id number]:10:[id number]:24 EDT ?Ventricular Rate:  103 ?PR Interval:  163 ?QRS Duration: 83 ?QT Interval:  351 ?QTC Calculation: 460 ?R Axis:   -10 ?Text Interpretation: Sinus tachycardia Low voltage, precordial leads Baseline wander in lead(s) V1 No old tracing to compare Confirmed by Myana Schlup, Baxter HireKristen [phone]([id number]) on 07/31/2021 5:40:26 AM ?  ? ?   ? ? ? ?RADIOLOGY: ?My personal review and interpretation of imaging: Chest x-ray shows findings consistent with CHF. ? ?I have personally reviewed all radiology reports.   ?DG Chest Port 1 View ? ?Result Date: 07/31/2021 ?CLINICAL DATA:  51 year old female with history of shortness of breath. EXAM: PORTABLE CHEST 1 VIEW COMPARISON:  No priors. FINDINGS: Lung volumes are very low. Elevation of the right hemidiaphragm. No definite pleural effusions. Cephalization of the pulmonary vasculature, with indistinct interstitial markings and patchy ill-defined opacities throughout the lungs bilaterally. Mild cardiomegaly. Upper mediastinal contours are distorted by patient positioning. Status post median sternotomy. IMPRESSION: 1. Low lung volumes with imaging findings concerning for congestive heart failure, as above. 2. Severe elevation of the right hemidiaphragm. Electronically Signed   By: Trudie Reedaniel  Entrikin M.D.   On: 07/31/2021 06:20   ? ? ?PROCEDURES: ? ?Critical Care performed: No ? ? ? ? ? ?.1-3 Lead EKG Interpretation ?Performed by: Angi Goodell, Layla MawKristen N, DO ?Authorized by: Raneem Mendolia, Layla MawKristen N, DO  ? ?  Interpretation: abnormal   ?  ECG rate:  103 ?  ECG rate assessment: tachycardic   ?  Rhythm: sinus tachycardia   ?  Ectopy: none   ?  Conduction: normal   ? ? ? ?IMPRESSION / MDM / ASSESSMENT AND PLAN / ED COURSE  ?I reviewed the triage vital signs and the nursing notes. ? ? ? ?Patient here with increased shortness of breath.  History of pulmonary hypertension, PE.  On oxygen chronically. ? ?The patient is on the cardiac monitor to evaluate for evidence of arrhythmia and/or significant heart rate changes. ? ? ?DIFFERENTIAL DIAGNOSIS (includes but not limited to):   Worsening pulmonary hypertension, CHF, PE, ACS, anemia, pneumonia, pneumothorax, asthma exacerbation ? ? ?PLAN: We will obtain CBC, BMP, BNP, troponin x2, chest x-ray, D-dimer, EKG.  Doing well on her normal 4 L nasal cannula.  Lung sounds are clear but very  difficult to appreciate due to her large body habitus. ? ? ?MEDICATIONS GIVEN IN ED: ?Medications  ?naloxone (NARCAN) injection 0.4 mg (has no administration in time range)  ?albuterol (PROVENTIL) (2.5 MG/3ML) 0.083% nebulizer solution 5 mg (5 mg Nebulization Given 07/31/21 [id number])  ?methylPREDNISolone sodium succinate (SOLU-MEDROL) 125 mg/2 mL injection 125 mg (125 mg Intravenous Given 07/31/21 0628)  ?furosemide (LASIX) injection 40 mg (40 mg Intravenous Given 07/31/21 0656)  ?fentaNYL (SUBLIMAZE) injection 50 mcg (50 mcg Intravenous Given 07/31/21 0728)  ? ? ? ?ED COURSE: Patient's labs show no leukocytosis.  Normal hemoglobin.  D-dimer negative.  COVID and flu negative.  Chest x-ray reviewed by myself and radiologist and shows pulmonary edema.  No history of CHF.  Will give IV Lasix.  Patient states she is feeling better after breathing treatments but would like admission to  the hospital.  Will discuss with hospitalist for admission for asthma exacerbation versus CHF exacerbation causing shortness of breath. ? ?Chemistry and troponin are still pending.  Called lab and they state there is a complication with the equipment that they are currently working on.  Hospitalist updated. ? ? ?Patient did have a brief drop in her oxygen saturation after getting fentanyl.  After repositioning oxygen came back up and she did not require nonrebreather, BiPAP or Narcan.  She was given fentanyl due to her request for pain control for her chronic back and left leg pain. ? ? ?CONSULTS:  Consulted and discussed patient's case with hospitalist, Dr. Joylene Igo.  I have recommended admission and consulting physician agrees and will place admission orders.  Patient (and family if present) agree with this plan.  ? ?I reviewed all nursing notes, vitals, pertinent previous records.  All labs, EKGs, imaging ordered have been independently reviewed and interpreted by myself. ? ? ? ?OUTSIDE RECORDS REVIEWED: No old records for  review. ? ? ? ? ? ? ? ? ?FINAL CLINICAL IMPRESSION(S) / ED DIAGNOSES  ? ?Final diagnoses:  ?SOB (shortness of breath)  ?Acute pulmonary edema (HCC)  ?Exacerbation of asthma, unspecified asthma severity, unspecified whether persistent  ? ? ? ?Rx / DC Orders

## 2021-07-31 NOTE — Assessment & Plan Note (Signed)
Unclear etiology ?Most likely diastolic dysfunction as patient has a known history of pulmonary hypertension ?Continue Lasix 40 mg IV daily ?Patient not on a beta-blocker or ACE due to relative hypotension ?Obtain 2D echocardiogram to assess LVEF ?We will consult cardiology ?

## 2021-08-01 ENCOUNTER — Telehealth: Payer: Self-pay | Admitting: Cardiology

## 2021-08-01 DIAGNOSIS — J9620 Acute and chronic respiratory failure, unspecified whether with hypoxia or hypercapnia: Secondary | ICD-10-CM | POA: Diagnosis not present

## 2021-08-01 DIAGNOSIS — I2699 Other pulmonary embolism without acute cor pulmonale: Secondary | ICD-10-CM

## 2021-08-01 DIAGNOSIS — J9621 Acute and chronic respiratory failure with hypoxia: Secondary | ICD-10-CM | POA: Diagnosis not present

## 2021-08-01 DIAGNOSIS — I5031 Acute diastolic (congestive) heart failure: Secondary | ICD-10-CM

## 2021-08-01 DIAGNOSIS — R0602 Shortness of breath: Secondary | ICD-10-CM | POA: Diagnosis not present

## 2021-08-01 DIAGNOSIS — I272 Pulmonary hypertension, unspecified: Secondary | ICD-10-CM

## 2021-08-01 LAB — CBC
HCT: 41.2 % (ref 36.0–46.0)
Hemoglobin: 11.6 g/dL — ABNORMAL LOW (ref 12.0–15.0)
MCH: 26.8 pg (ref 26.0–34.0)
MCHC: 28.2 g/dL — ABNORMAL LOW (ref 30.0–36.0)
MCV: 95.2 fL (ref 80.0–100.0)
Platelets: 103 10*3/uL — ABNORMAL LOW (ref 150–400)
RBC: 4.33 MIL/uL (ref 3.87–5.11)
RDW: 15 % (ref 11.5–15.5)
WBC: 4.9 10*3/uL (ref 4.0–10.5)
nRBC: 0 % (ref 0.0–0.2)

## 2021-08-01 LAB — LIPID PANEL
Cholesterol: 165 mg/dL (ref 0–200)
HDL: 39 mg/dL — ABNORMAL LOW (ref >40)
LDL Cholesterol: 87 mg/dL (ref 0–99)
Total CHOL/HDL Ratio: 4.2 RATIO
Triglycerides: 193 mg/dL — ABNORMAL HIGH (ref <150)
VLDL: 39 mg/dL (ref 0–40)

## 2021-08-01 LAB — BASIC METABOLIC PANEL
BUN: 19 mg/dL (ref 6–20)
CO2: >45 mmol/L — ABNORMAL HIGH (ref 22–32)
Calcium: 8.8 mg/dL — ABNORMAL LOW (ref 8.9–10.3)
Chloride: 89 mmol/L — ABNORMAL LOW (ref 98–111)
Creatinine, Ser: <0.3 mg/dL — ABNORMAL LOW (ref 0.44–1.00)
Glucose, Bld: 121 mg/dL — ABNORMAL HIGH (ref 70–99)
Potassium: 4.5 mmol/L (ref 3.5–5.1)
Sodium: 144 mmol/L (ref 135–145)

## 2021-08-01 LAB — HIV ANTIBODY (ROUTINE TESTING W REFLEX): HIV Screen 4th Generation wRfx: NONREACTIVE

## 2021-08-01 MED ORDER — FUROSEMIDE 40 MG PO TABS: 40.0000 mg | ORAL_TABLET | Freq: Every day | ORAL | 1 refills | Status: DC

## 2021-08-01 NOTE — Telephone Encounter (Signed)
-----   Message from Gibson Ramp, RN sent at 08/01/2021 11:42 AM EDT ----- ?Regarding: FW: hospital follow-up ? ?----- Message ----- ?From: Fransico Michael, Cadence H, PA-C ?Sent: 08/01/2021  10:55 AM EDT ?To: [facility] Triage, [facility] Scheduling ?Subject: hospital follow-up                            ? ?Pt needs hospital follow-up with [facility] in 3-4 weeks.  ? ? ?

## 2021-08-01 NOTE — [place] FL2 (Signed)
?[place] MEDICAID FL2 LEVEL OF CARE SCREENING TOOL  ?  ? ?IDENTIFICATION  ?Patient Name: ?Sharon Black Birthdate: 1970/09/08 Sex: female Admission Date (Current Location): ?07/31/2021  ?[place] and [place] Number: ? [place] ?  Facility and Address:  ?[facility], [address], [place], [place] [address] ?     Provider Number: ?[id number]  ?Attending Physician Name and Address:  ?Gwynne Edinger, MD ? Relative Name and Phone Number:  ?  ?   ?Current Level of Care: ?Hospital Recommended Level of Care: ?[place] Prior Approval Number: ?  ? ?Date Approved/Denied: ?  PASRR Number: ?[id number] C ? ?Discharge Plan: ?SNF ?  ? ?Current Diagnoses: ?Patient Active Problem List  ? Diagnosis Date Noted  ? Acute on chronic respiratory failure ([place]) 07/31/2021  ? Depression 07/31/2021  ? Pulmonary hypertension ([place])   ? Pulmonary emboli ([place])   ? Morbid obesity ([place])   ? History of CVA with residual deficit   ? Acute CHF (congestive heart failure) ([place])   ? SOB (shortness of breath)   ? ? ?Orientation RESPIRATION BLADDER Height & Weight   ?  ?Self, Time, Situation, Place ? O2 (4 L nasal cannula) Incontinent, External catheter Weight: 287 lb 0.6 oz (130.2 kg) ?Height:  5\' 3"  (160 cm)  ?BEHAVIORAL SYMPTOMS/MOOD NEUROLOGICAL BOWEL NUTRITION STATUS  ?   (History of CVA) Continent Diet (2 gram sodium)  ?AMBULATORY STATUS COMMUNICATION OF NEEDS Skin   ?Total Care Verbally Other (Comment) (Erythema/redness, rash.) ?  ?  ?  ?    ?     ?     ? ? ?Personal Care Assistance Level of Assistance  ?Total care   ?  ?  ?Total Care Assistance: Maximum assistance  ? ?Functional Limitations Info  ?Sight, Hearing, Speech Sight Info: Adequate ?Hearing Info: Adequate ?Speech Info: Adequate  ? ? ?SPECIAL CARE FACTORS FREQUENCY  ?    ?  ?  ?  ?  ?  ?  ?   ? ? ?Contractures Contractures Info: Not present  ? ? ?Additional Factors Info  ?Code Status, Allergies, Psychotropic Code Status Info: Full  code ?Allergies Info: Sulphamethoxydiazine, Tetracyclines and related ?Psychotropic Info: Depression ?  ?  ?   ? ?Current Medications (08/01/2021):  This is the current hospital active medication list ?Current Facility-Administered Medications  ?Medication Dose Route Frequency Provider Last Rate Last Admin  ? acetaminophen (TYLENOL) tablet 650 mg  650 mg Oral Q6H PRN Agbata, Tochukwu, MD      ? Or  ? acetaminophen (TYLENOL) suppository 650 mg  650 mg Rectal Q6H PRN Agbata, Tochukwu, MD      ? aspirin chewable tablet 81 mg  81 mg Oral Daily Furth, Cadence H, PA-C   81 mg at 07/31/21 1224  ? atorvastatin (LIPITOR) tablet 40 mg  40 mg Oral QHS Agbata, Tochukwu, MD   40 mg at 07/31/21 2120  ? calcium carbonate (TUMS - dosed in mg elemental calcium) chewable tablet 200 mg of elemental calcium  1 tablet Oral 2 times per day Agbata, Tochukwu, MD   200 mg of elemental calcium at 08/01/21 0839  ? Chlorhexidine Gluconate Cloth 2 % PADS 6 each  6 each Topical Q0600 Agbata, Tochukwu, MD   6 each at 08/01/21 0600  ? divalproex (DEPAKOTE SPRINKLE) capsule 250 mg  250 mg Oral Q1200 Agbata, Tochukwu, MD   250 mg at 07/31/21 1518  ? divalproex (DEPAKOTE) DR tablet 500 mg  500 mg Oral 2 times per day  Collier Bullock, MD   500 mg at 08/01/21 [id number]  ? docusate sodium (COLACE) capsule 100 mg  100 mg Oral Daily Agbata, Tochukwu, MD   100 mg at 07/31/21 1518  ? enoxaparin (LOVENOX) injection 60 mg  60 mg Subcutaneous Q12H Agbata, Tochukwu, MD   60 mg at 08/01/21 0402  ? furosemide (LASIX) injection 40 mg  40 mg Intravenous Daily Agbata, Tochukwu, MD      ? ipratropium-albuterol (DUONEB) 0.5-2.5 (3) MG/3ML nebulizer solution 3 mL  3 mL Nebulization Q12H PRN Agbata, Tochukwu, MD      ? lidocaine (LIDODERM) 5 % 1 patch  1 patch Transdermal Q24H Agbata, Tochukwu, MD   1 patch at 07/31/21 1400  ? mupirocin ointment (BACTROBAN) 2 % 1 application.  1 application. Nasal BID Agbata, Tochukwu, MD   1 application. at 07/31/21 2304  ? naloxone  [facility]) injection 0.4 mg  0.4 mg Intravenous Once Ward, Kristen N, DO      ? ondansetron (ZOFRAN) tablet 4 mg  4 mg Oral Q6H PRN Agbata, Tochukwu, MD      ? Or  ? ondansetron (ZOFRAN) injection 4 mg  4 mg Intravenous Q6H PRN Agbata, Tochukwu, MD   4 mg at 07/31/21 1255  ? pantoprazole (PROTONIX) EC tablet 20 mg  20 mg Oral Daily Agbata, Tochukwu, MD   20 mg at 07/31/21 1519  ? polyethylene glycol (MIRALAX / GLYCOLAX) packet 17 g  17 g Oral Daily PRN Agbata, Tochukwu, MD      ? polyvinyl alcohol (LIQUIFILM TEARS) 1.4 % ophthalmic solution 1 drop  1 drop Both Eyes BID Agbata, Tochukwu, MD      ? pregabalin (LYRICA) capsule 50 mg  50 mg Oral 2 times per day Agbata, Tochukwu, MD   50 mg at 08/01/21 0839  ? risperiDONE (RISPERDAL) tablet 0.5 mg  0.5 mg Oral 2 times per day Agbata, Tochukwu, MD   0.5 mg at 08/01/21 0839  ? sertraline (ZOLOFT) tablet 100 mg  100 mg Oral Daily Agbata, Tochukwu, MD   100 mg at 07/31/21 1518  ? traMADol (ULTRAM) tablet 50 mg  50 mg Oral Q6H PRN Agbata, Tochukwu, MD   50 mg at 08/01/21 0402  ? ? ? ?Discharge Medications: ?Please see discharge summary for a list of discharge medications. ? ?Relevant Imaging Results: ? ?Relevant Lab Results: ? ? ?Additional Information ?SS#: [id number] ? ?Candie Chroman, LCSW ? ? ? ? ?

## 2021-08-01 NOTE — Progress Notes (Signed)
? ?Progress Note ? ?Patient Name: Sharon Black ?Date of Encounter: 08/01/2021 ? ?[facility] Cardiologist: [facility] ? ?Subjective  ? ?Doing well this morning, feels her breathing is improving ?Long discussion concerning fluid intake at nursing facility ?Reports drinking lots of apple juice among other beverages ?Echocardiogram results reviewed with her, normal ejection fraction, no significant valvular heart disease ? ?Inpatient Medications  ?  ?Scheduled Meds: ? aspirin  81 mg Oral Daily  ? atorvastatin  40 mg Oral QHS  ? calcium carbonate  1 tablet Oral 2 times per day  ? Chlorhexidine Gluconate Cloth  6 each Topical Q0600  ? divalproex  250 mg Oral Q1200  ? divalproex  500 mg Oral 2 times per day  ? docusate sodium  100 mg Oral Daily  ? enoxaparin  60 mg Subcutaneous Q12H  ? furosemide  40 mg Intravenous Daily  ? lidocaine  1 patch Transdermal Q24H  ? mupirocin ointment  1 application. Nasal BID  ? naLOXone (NARCAN)  injection  0.4 mg Intravenous Once  ? pantoprazole  20 mg Oral Daily  ? polyvinyl alcohol  1 drop Both Eyes BID  ? pregabalin  50 mg Oral 2 times per day  ? risperiDONE  0.5 mg Oral 2 times per day  ? sertraline  100 mg Oral Daily  ? ?Continuous Infusions: ? ?PRN Meds: ?acetaminophen **OR** acetaminophen, ipratropium-albuterol, ondansetron **OR** ondansetron (ZOFRAN) IV, polyethylene glycol, traMADol  ? ?Vital Signs  ?  ?Vitals:  ? 08/01/21 0252 08/01/21 0406 08/01/21 0745 08/01/21 0759  ?BP:  106/65  (!) 104/54  ?Pulse:  (!) 103  (!) 105  ?Resp:  20  18  ?Temp:  97.8 ?F (36.6 ?C)  98 ?F (36.7 ?C)  ?TempSrc:  Oral    ?SpO2: 95% 97% 95% 96%  ?Weight:      ?Height:      ? ? ?Intake/Output Summary (Last 24 hours) at 08/01/2021 1020 ?Last data filed at 08/01/2021 7989 ?Gross per 24 hour  ?Intake --  ?Output 600 ml  ?Net -600 ml  ? ? ?  07/31/2021  ? 11:00 PM 07/31/2021  ?  5:20 AM  ?Last 3 Weights  ?Weight (lbs) 287 lb 0.6 oz 200 lb  ?Weight (kg) 130.2 kg 90.719 kg  ?   ? ?Telemetry  ?  ?NSR -  Personally Reviewed ? ?ECG  ?  ? - Personally Reviewed ? ?Physical Exam  ? ?GEN: No acute distress.  Obese ?Neck: No JVD ?Cardiac: RRR, no murmurs, rubs, or gallops.  ?Respiratory: Clear to auscultation bilaterally. ?GI: Soft, nontender, non-distended  ?MS: No edema; No deformity. ?Neuro: Left-sided weakness ?Psych: Normal affect  ? ?Labs  ?  ?High Sensitivity Troponin:   ?Recent Labs  ?Lab 07/31/21 ?2119 07/31/21 ?0730  ?TROPONINIHS 9 8  ?   ?Chemistry ?Recent Labs  ?Lab 07/31/21 ?4174 08/01/21 ?0331  ?NA 141 144  ?K QUANTITY NOT SUFFICIENT, UNABLE TO PERFORM TEST 4.5  ?CL 91* 89*  ?CO2 43* >45*  ?GLUCOSE 100* 121*  ?BUN 21* 19  ?CREATININE <0.30* <0.30*  ?CALCIUM 8.9 8.8*  ?GFRNONAA NOT CALCULATED NOT CALCULATED  ?ANIONGAP 7 NOT CALCULATED  ?  ?Lipids  ?Recent Labs  ?Lab 08/01/21 ?0331  ?CHOL 165  ?TRIG 193*  ?HDL 39*  ?LDLCALC 87  ?CHOLHDL 4.2  ?  ?Hematology ?Recent Labs  ?Lab 07/31/21 ?0814 08/01/21 ?0331  ?WBC 4.5 4.9  ?RBC 4.27 4.33  ?HGB 11.5* 11.6*  ?HCT 40.4 41.2  ?MCV 94.6 95.2  ?MCH 26.9 26.8  ?  MCHC 28.5* 28.2*  ?RDW 15.3 15.0  ?PLT 121* 103*  ? ?Thyroid No results for input(s): TSH, FREET4 in the last 168 hours.  ?BNP ?Recent Labs  ?Lab 07/31/21 ?0540  ?BNP 44.9  ?  ?DDimer  ?Recent Labs  ?Lab 07/31/21 ?0540  ?DDIMER 0.28  ?  ? ?Radiology  ?  ?CT CHEST WO CONTRAST ? ?Result Date: 07/31/2021 ?CLINICAL DATA:  Shortness of breath.  CVA.  Bed-bound patient. EXAM: CT CHEST WITHOUT CONTRAST TECHNIQUE: Multidetector CT imaging of the chest was performed following the standard protocol without IV contrast. RADIATION DOSE REDUCTION: This exam was performed according to the departmental dose-optimization program which includes automated exposure control, adjustment of the mA and/or kV according to patient size and/or use of iterative reconstruction technique. COMPARISON:  Chest radiograph of earlier today FINDINGS: Cardiovascular: Mild motion degradation, most significant in the lung bases. Median sternotomy for  prior CABG. Aortic atherosclerosis. Tortuous thoracic aorta. Upper normal ascending aortic caliber including at 3.9 cm on coronal image 70. Moderate cardiomegaly, without pericardial effusion. Pulmonary artery enlargement, outflow tract 3.3 cm Mediastinum/Nodes: No mediastinal or definite hilar adenopathy, given limitations of unenhanced CT. Lungs/Pleura: No pleural fluid. Hypoventilation, resulting in areas of mosaic attenuation likely related to air trapping. Somewhat more focal ground-glass in the lung apices with mild septal thickening at the right apex, including on 32/4. No lobar consolidation. Upper Abdomen: Normal imaged portions of the liver, spleen, stomach, pancreas, gallbladder, adrenal glands, kidneys. Musculoskeletal: No acute osseous abnormality. IMPRESSION: 1. Mildly motion degraded exam. 2. No evidence of pneumonia. Suspect pulmonary venous congestion, primarily at the apices. Concurrent hypoventilation with areas of air trapping. 3. Cardiomegaly. 4. Pulmonary artery enlargement suggests pulmonary arterial hypertension. 5. Upper normal ascending aortic caliber, 3.9 cm. 6.  Aortic Atherosclerosis (ICD10-I70.0). Electronically Signed   By: Jeronimo GreavesKyle  Talbot M.D.   On: 07/31/2021 08:31  ? ?DG Chest Port 1 View ? ?Result Date: 07/31/2021 ?CLINICAL DATA:  51 year old female with history of shortness of breath. EXAM: PORTABLE CHEST 1 VIEW COMPARISON:  No priors. FINDINGS: Lung volumes are very low. Elevation of the right hemidiaphragm. No definite pleural effusions. Cephalization of the pulmonary vasculature, with indistinct interstitial markings and patchy ill-defined opacities throughout the lungs bilaterally. Mild cardiomegaly. Upper mediastinal contours are distorted by patient positioning. Status post median sternotomy. IMPRESSION: 1. Low lung volumes with imaging findings concerning for congestive heart failure, as above. 2. Severe elevation of the right hemidiaphragm. Electronically Signed   By: Trudie Reedaniel   Entrikin M.D.   On: 07/31/2021 06:20  ? ?ECHOCARDIOGRAM COMPLETE ? ?Result Date: 07/31/2021 ?   ECHOCARDIOGRAM REPORT   Patient Name:   Sharon HousemanMELISSA Black Date of Exam: 07/31/2021 Medical Rec #:  [id number]        Height:       63.0 in Accession #:    [id number]       Weight:       200.0 lb Date of Birth:  1970/07/01        BSA:          1.934 m? Patient Age:    51 years         BP:           112/74 mmHg Patient Gender: F                HR:           108 bpm. Exam Location:  [facility] Procedure: 2D Echo, Color Doppler, Cardiac Doppler and Intracardiac  Opacification Agent Indications:     I50.31 congestive heart failure-Acute Diastolic  History:         Patient has no prior history of Echocardiogram examinations.                  Stroke; Signs/Symptoms:Shortness of Breath.  Sonographer:     Humphrey Rolls Referring Phys:  [id number] AGBATA Diagnosing Phys: Debbe Odea MD  Sonographer Comments: Suboptimal apical window and no subcostal window. Image acquisition challenging due to patient body habitus. IMPRESSIONS  1. Left ventricular ejection fraction, by estimation, is 60 to 65%. The left ventricle has normal function. The left ventricle has no regional wall motion abnormalities. Left ventricular diastolic parameters are consistent with Grade I diastolic dysfunction (impaired relaxation).  2. Right ventricular systolic function is normal. The right ventricular size is normal.  3. The mitral valve is normal in structure. No evidence of mitral valve regurgitation.  4. The aortic valve is tricuspid. Aortic valve regurgitation is not visualized. Aortic valve sclerosis is present, with no evidence of aortic valve stenosis. FINDINGS  Left Ventricle: Left ventricular ejection fraction, by estimation, is 60 to 65%. The left ventricle has normal function. The left ventricle has no regional wall motion abnormalities. Definity contrast agent was given IV to delineate the left ventricular  endocardial borders. The left  ventricular internal cavity size was normal in size. There is no left ventricular hypertrophy. Left ventricular diastolic parameters are consistent with Grade I diastolic dysfunction (impaired relaxatio

## 2021-08-01 NOTE — Care Management CC44 (Signed)
Condition Code 44 Documentation Completed ? ?Patient Details  ?Name: Sharon Black ?MRN: [id number] ?Date of Birth: [birth year] ? ? ?Condition Code 44 given:  Yes ?Patient signature on Condition Code 44 notice:  Yes ?Documentation of 2 MD's agreement:  Yes ?Code 44 added to claim:  Yes ? ? ? ?Margarito Liner, LCSW ?08/01/2021, 10:37 AM ? ?

## 2021-08-01 NOTE — Telephone Encounter (Signed)
LMOV to schedule  

## 2021-08-01 NOTE — Discharge Summary (Addendum)
Sharon HousemanMelissa Black [id number] DOB: June 15, 1970 DOA: 07/31/2021 ? ?PCP: Sherol DadeSimpson-Tarokh, [name], DO ? ?Admit date: 07/31/2021 ?Discharge date: 08/01/2021 ? ?Time spent: 45 minutes ? ?Recommendations for Outpatient Follow-up:  ?Pulmonology f/u, needs evaluation for OHS/OSA  ?BMP 1 week (furosemide initiated) ? ? ? ?Discharge Diagnoses:  ?Principal Problem: ?  Acute on chronic respiratory failure (HCC) ?Active Problems: ?  Acute CHF (congestive heart failure) (HCC) ?  Pulmonary hypertension (HCC) ?  Pulmonary emboli (HCC) ?  Morbid obesity (HCC) ?  History of CVA with residual deficit ?  Depression ?  Acute and chronic respiratory failure with hypoxia (HCC) ? ? ?Discharge Condition: improved ? ?Diet recommendation: heart healthy ? ?Filed Weights  ? 07/31/21 0520 07/31/21 2300  ?Weight: 90.7 kg 130.2 kg  ? ? ?History of present illness:  ?From admission h and p: ?Sharon Black is a 51 y.o. female with medical history significant for morbid obesity, history of CVA with left-sided hemiparesis, chronic respiratory failure on 4 L of oxygen, history of pulmonary embolism on chronic anticoagulation therapy with Lovenox 50 mg twice daily and pulmonary hypertension who [facility] breath. ?At baseline patient wears 4 L of oxygen and per nursing home staff she had episodes of hypoxia with pulse oximetry dropping in the low 80s despite her being on her regular oxygen supplementation. ?She denies having any fever or chills, she denies having any chest pain, no cough, no abdominal pain, no changes in her bowel habits, no dizziness, no lightheadedness, no new focal deficits or blurred vision. ?She has chronic left leg pain which is unchanged. ?Patient received a dose of IV Lasix in the ER as well as a nebulizer treatment.  She was given a dose of fentanyl for her chronic left leg pain and had worsening hypoxia that improved with repositioning. ? ?Hospital Course:  ?Patient  brought in for acute on chronic hypoxic respiratory failure. Here initial suspicion was CHF exacerbation, though bnp normal and TTE was obtained which showed grade 1 diastolic dysfunction, otherwise unremarkable. CT of chest did show vascular congestion and possible signs of pulmonary htn, which patient has a history of. Labs significant for markedly elevated bicarb. By hospital day 1 patient was weaned to her home O2 of 4 L. She is mentating clearly and reports feeling back to her baseline. She reports she has a pulmonologist but reports not having a sleep study. I suspect she has OHS and that hypercarbia could have led to transient altered consciousness and hypoxemia. I strongly advised f/u with her pulmonologist to pursue evaluation and treatment of this disorder. Cardiology did advise starting furosemide, will need bmp in 1 week. ? ?Procedures: ?none  ? ?Consultations: ?cardiology ? ?Discharge Exam: ?Vitals:  ? 08/01/21 0745 08/01/21 0759  ?BP:  (!) 104/54  ?Pulse:  (!) 105  ?Resp:  18  ?Temp:  98 ?F (36.7 ?C)  ?SpO2: 95% 96%  ? ? ?General: morbidly obese ?Cardiovascular: RRR, distant heart sounds ?Respiratory: rales at bases ?Neuro: left sided paralysis ? ?Discharge Instructions ? ? ?Discharge Instructions   ? ? Diet - low sodium heart healthy   Complete by: As directed ?  ? Increase activity slowly   Complete by: As directed ?  ? ?  ? ?Allergies as of 08/01/2021   ? ?   Reactions  ? Sulphamethoxydiazine Diarrhea  ? Tetracyclines & Related Diarrhea  ? ?  ? ?  ?Medication List  ?  ? ?TAKE these medications   ? ?  acetaminophen 650 MG CR tablet ?Commonly known as: TYLENOL ?Take 650 mg by mouth every 8 (eight) hours as needed for pain. ?  ?Aspirin Low Dose 81 MG chewable tablet ?Generic drug: aspirin ?Chew 81 mg by mouth daily. ?  ?atorvastatin 40 MG tablet ?Commonly known as: LIPITOR ?Take 40 mg by mouth at bedtime. ?  ?calcium carbonate 500 MG chewable tablet ?Commonly known as: TUMS - dosed in mg elemental  calcium ?Chew 1 tablet by mouth in the morning and at bedtime. 0800 & 1600 ?  ?divalproex 125 MG capsule ?Commonly known as: DEPAKOTE SPRINKLE ?Take 250 mg by mouth daily at 12 noon. ?  ?divalproex 500 MG DR tablet ?Commonly known as: DEPAKOTE ?Take 500 mg by mouth 2 (two) times daily. 0800 & 1600 ?  ?docusate sodium 100 MG capsule ?Commonly known as: COLACE ?Take 100 mg by mouth daily. ?  ?enoxaparin 60 MG/0.6ML injection ?Commonly known as: LOVENOX ?Inject 60 mg into the skin in the morning and at bedtime. ?  ?fexofenadine 60 MG tablet ?Commonly known as: ALLEGRA ?Take 60 mg by mouth daily. ?  ?furosemide 40 MG tablet ?Commonly known as: Lasix ?Take 1 tablet (40 mg total) by mouth daily. ?  ?ibuprofen 200 MG tablet ?Commonly known as: ADVIL ?Take 400 mg by mouth every 6 (six) hours as needed. ?  ?ipratropium-albuterol 0.5-2.5 (3) MG/3ML Soln ?Commonly known as: DUONEB ?Take 3 mLs by nebulization every 12 (twelve) hours as needed. ?  ?lidocaine 5 % ?Commonly known as: LIDODERM ?Place 1 patch onto the skin daily. Remove & Discard patch within 12 hours or as directed by MD ?  ?ondansetron 4 MG tablet ?Commonly known as: ZOFRAN ?Take 4 mg by mouth every 6 (six) hours as needed. ?  ?pantoprazole 20 MG tablet ?Commonly known as: PROTONIX ?Take 20 mg by mouth daily. ?  ?polyethylene glycol 17 g packet ?Commonly known as: MIRALAX / GLYCOLAX ?Take 17 g by mouth daily as needed. ?  ?pregabalin 50 MG capsule ?Commonly known as: LYRICA ?Take 50 mg by mouth 2 (two) times daily. 0800 & 1600 ?  ?Refresh Liquigel 1 % Gel ?Generic drug: Carboxymethylcellulose Sodium ?Apply 1 drop to eye in the morning and at bedtime. 1000 & 1800 ?  ?risperiDONE 0.5 MG tablet ?Commonly known as: RISPERDAL ?Take 0.5 mg by mouth 2 (two) times daily. 0800 & 1600 ?  ?sertraline 100 MG tablet ?Commonly known as: ZOLOFT ?Take 100 mg by mouth in the morning. ?  ?traMADol 50 MG tablet ?Commonly known as: ULTRAM ?Take 1 tablet by mouth every 8 (eight) hours  as needed. ?  ? ?  ? ?Allergies  ?Allergen Reactions  ? Sulphamethoxydiazine Diarrhea  ? Tetracyclines & Related Diarrhea  ? ? Follow-up Information   ? ? [name], [name], DO Follow up.   ?Specialty: Family Medicine ?Contact information: ?[address] ?[place] [address] ?[phone] ? ? ?  ?  ? ? Your pulmonologist Follow up.   ? ?  ?  ? ?  ?  ? ?  ? ? ? ?The results of significant diagnostics from this hospitalization (including imaging, microbiology, ancillary and laboratory) are listed below for reference.   ? ?Significant Diagnostic Studies: ?CT CHEST WO CONTRAST ? ?Result Date: 07/31/2021 ?CLINICAL DATA:  Shortness of breath.  CVA.  Bed-bound patient. EXAM: CT CHEST WITHOUT CONTRAST TECHNIQUE: Multidetector CT imaging of the chest was performed following the standard protocol without IV contrast. RADIATION DOSE REDUCTION: This exam was performed according to the departmental  dose-optimization program which includes automated exposure control, adjustment of the mA and/or kV according to patient size and/or use of iterative reconstruction technique. COMPARISON:  Chest radiograph of earlier today FINDINGS: Cardiovascular: Mild motion degradation, most significant in the lung bases. Median sternotomy for prior CABG. Aortic atherosclerosis. Tortuous thoracic aorta. Upper normal ascending aortic caliber including at 3.9 cm on coronal image 70. Moderate cardiomegaly, without pericardial effusion. Pulmonary artery enlargement, outflow tract 3.3 cm Mediastinum/Nodes: No mediastinal or definite hilar adenopathy, given limitations of unenhanced CT. Lungs/Pleura: No pleural fluid. Hypoventilation, resulting in areas of mosaic attenuation likely related to air trapping. Somewhat more focal ground-glass in the lung apices with mild septal thickening at the right apex, including on 32/4. No lobar consolidation. Upper Abdomen: Normal imaged portions of the liver, spleen, stomach, pancreas, gallbladder, adrenal  glands, kidneys. Musculoskeletal: No acute osseous abnormality. IMPRESSION: 1. Mildly motion degraded exam. 2. No evidence of pneumonia. Suspect pulmonary venous congestion, primarily at the apices. Concurrent hypov

## 2021-08-01 NOTE — TOC Initial Note (Signed)
Transition of Care (TOC) - Initial/Assessment Note  ? ? ?Patient Details  ?Name: Sharon Black ?MRN: [id number] ?Date of Birth: Oct 27, 1970 ? ?Transition of Care (TOC) CM/SW Contact:    ?Candie Chroman, LCSW ?Phone Number: ?08/01/2021, 10:11 AM ? ?Clinical Narrative:   CSW met with patient. No supports at bedside. CSW introduced role and explained that discharge planning would be discussed. Patient is a long-term resident at [facility] and has lived there two years. She does not want to return but explained MD is planning on discharging her back there today and we cannot start long-term placement search here in the hospital. Admissions coordinator will notify their social worker that patient wants to find another facility. Per MD, patient weaned down to her chronic 4 L oxygen. No further concerns. CSW encouraged patient to contact CSW as needed. CSW will continue to follow patient for support and facilitate return to SNF today.              ? ?Expected Discharge Plan: [place] ?Barriers to Discharge: No Barriers Identified ? ? ?Patient Goals and CMS Choice ?  ?  ?Choice offered to / list presented to : NA ? ?Expected Discharge Plan and Services ?Expected Discharge Plan: [place] ?  ?  ?Post Acute Care Choice: Resumption of Svcs/PTA Provider ?Living arrangements for the past 2 months: [place] ?                ?  ?  ?  ?  ?  ?  ?  ?  ?  ?  ? ?Prior Living Arrangements/Services ?Living arrangements for the past 2 months: [place] ?Lives with:: Facility Resident ?Patient language and need for interpreter reviewed:: Yes ?Do you feel safe going back to the place where you live?:  (Wants to find another facility to go to.)      ?Need for Family Participation in Patient Care: Yes (Comment) ?Care giver support system in place?: Yes (comment) ?  ?Criminal Activity/Legal Involvement Pertinent to Current Situation/Hospitalization: No - Comment as  needed ? ?Activities of Daily Living ?Home Assistive Devices/Equipment: [facility] bed ?ADL Screening (condition at time of admission) ?Patient's cognitive ability adequate to safely complete daily activities?: Yes ?Is the patient deaf or have difficulty hearing?: No ?Does the patient have difficulty seeing, even when wearing glasses/contacts?: No ?Does the patient have difficulty concentrating, remembering, or making decisions?: No ?Patient able to express need for assistance with ADLs?: Yes ?Does the patient have difficulty dressing or bathing?: Yes ?Independently performs ADLs?: No ?Communication: Independent ?Dressing (OT): Needs assistance ?Is this a change from baseline?: Pre-admission baseline ?Grooming: Needs assistance ?Is this a change from baseline?: Pre-admission baseline ?Feeding: Independent ?Bathing: Needs assistance ?Is this a change from baseline?: Pre-admission baseline ?Toileting: Dependent ?Is this a change from baseline?: Pre-admission baseline ?In/Out Bed: Dependent ?Is this a change from baseline?: Pre-admission baseline ?Walks in Home: Dependent ?Is this a change from baseline?: Pre-admission baseline ?Does the patient have difficulty walking or climbing stairs?: Yes ?Weakness of Legs: Both ?Weakness of Arms/Hands: Both ? ?Permission Sought/Granted ?Permission sought to share information with : Customer service manager ?Permission granted to share information with : Yes, Verbal Permission Granted ?   ? Permission granted to share info w AGENCY: [facility] ?   ?   ? ?Emotional Assessment ?Appearance:: Appears stated age ?Attitude/Demeanor/Rapport: Engaged ?Affect (typically observed): Appropriate, Calm ?Orientation: : Oriented to Self, Oriented to Place, Oriented to  Time, Oriented to Situation ?Alcohol / Substance Use: Not Applicable ?Psych Involvement: No (comment) ? ?Admission diagnosis:  Acute respiratory failure ([place]) [J96.00] ?Acute pulmonary edema (HCC)  [J81.0] ?SOB (shortness of breath) [R06.02] ?Acute on chronic respiratory failure ([place]) [J96.20] ?Exacerbation of asthma, unspecified asthma severity, unspecified whether persistent [J45.901] ?Patient Active Problem List  ? Diagnosis Date Noted  ? Acute on chronic respiratory failure ([place]) 07/31/2021  ? Depression 07/31/2021  ? Pulmonary hypertension ([place])   ? Pulmonary emboli ([place])   ? Morbid obesity ([place])   ? History of CVA with residual deficit   ? Acute CHF (congestive heart failure) ([place])   ? SOB (shortness of breath)   ? ?PCP:  Hal Morales, DO ?Pharmacy:  No Pharmacies Listed ? ? ? ?Social Determinants of Health (SDOH) Interventions ?  ? ?Readmission Risk Interventions ?   ? View : No data to display.  ?  ?  ?  ? ? ? ?

## 2021-08-01 NOTE — Care Management Obs Status (Signed)
MEDICARE OBSERVATION STATUS NOTIFICATION ? ? ?Patient Details  ?Name: Sharon Black ?MRN: [id number] ?Date of Birth: 10/31/1970 ? ? ?Medicare Observation Status Notification Given:  Yes ? ? ? ?Margarito Liner, LCSW ?08/01/2021, 10:37 AM ?

## 2021-08-01 NOTE — TOC Transition Note (Signed)
Transition of Care (TOC) - CM/SW Discharge Note ? ? ?Patient Details  ?Name: Sharon Black ?MRN: [id number] ?Date of Birth: 02-14-1971 ? ?Transition of Care (TOC) CM/SW Contact:  ?Candie Chroman, LCSW ?Phone Number: ?08/01/2021, 10:53 AM ? ? ?Clinical Narrative:  Patient has orders to discharge back to [facility] today. RN will call report to [phone] (Room 110A). EMS transport has been arranged and she is first on the list. No further concerns. CSW signing off.  ? ?Final next level of care: [place] ?Barriers to Discharge: No Barriers Identified ? ? ?Patient Goals and CMS Choice ?  ?  ?Choice offered to / list presented to : NA ? ?Discharge Placement ?  ?Existing PASRR number confirmed : 08/01/21          ?Patient chooses bed at: [facility] ?Patient to be transferred to facility by: EMS ?  ?Patient and family notified of of transfer: 08/01/21 ? ?Discharge Plan and Services ?  ?  ?Post Acute Care Choice: Resumption of Svcs/PTA Provider          ?  ?  ?  ?  ?  ?  ?  ?  ?  ?  ? ?Social Determinants of Health (SDOH) Interventions ?  ? ? ?Readmission Risk Interventions ?   ? View : No data to display.  ?  ?  ?  ? ? ? ? ? ?

## 2022-04-01 ENCOUNTER — Other Ambulatory Visit: Payer: Self-pay

## 2022-04-01 ENCOUNTER — Inpatient Hospital Stay: Payer: Medicare Other

## 2022-04-01 ENCOUNTER — Emergency Department: Payer: Medicare Other

## 2022-04-01 ENCOUNTER — Inpatient Hospital Stay
Admission: EM | Admit: 2022-04-01 | Discharge: 2022-04-18 | DRG: 193 | Disposition: A | Discharge status: Hospice | Payer: Medicare Other | Source: Skilled Nursing Facility | Attending: Internal Medicine | Admitting: Internal Medicine

## 2022-04-01 DIAGNOSIS — J9621 Acute and chronic respiratory failure with hypoxia: Secondary | ICD-10-CM | POA: Diagnosis present

## 2022-04-01 DIAGNOSIS — I69352 Hemiplegia and hemiparesis following cerebral infarction affecting left dominant side: Secondary | ICD-10-CM

## 2022-04-01 DIAGNOSIS — M21371 Foot drop, right foot: Secondary | ICD-10-CM | POA: Diagnosis present

## 2022-04-01 DIAGNOSIS — G8929 Other chronic pain: Secondary | ICD-10-CM | POA: Diagnosis present

## 2022-04-01 DIAGNOSIS — R161 Splenomegaly, not elsewhere classified: Secondary | ICD-10-CM | POA: Diagnosis present

## 2022-04-01 DIAGNOSIS — J1100 Influenza due to unidentified influenza virus with unspecified type of pneumonia: Secondary | ICD-10-CM | POA: Insufficient documentation

## 2022-04-01 DIAGNOSIS — Z9981 Dependence on supplemental oxygen: Secondary | ICD-10-CM

## 2022-04-01 DIAGNOSIS — I69354 Hemiplegia and hemiparesis following cerebral infarction affecting left non-dominant side: Secondary | ICD-10-CM | POA: Diagnosis not present

## 2022-04-01 DIAGNOSIS — J1008 Influenza due to other identified influenza virus with other specified pneumonia: Secondary | ICD-10-CM | POA: Diagnosis present

## 2022-04-01 DIAGNOSIS — J9811 Atelectasis: Secondary | ICD-10-CM | POA: Diagnosis present

## 2022-04-01 DIAGNOSIS — D649 Anemia, unspecified: Secondary | ICD-10-CM | POA: Diagnosis present

## 2022-04-01 DIAGNOSIS — I5032 Chronic diastolic (congestive) heart failure: Secondary | ICD-10-CM | POA: Diagnosis present

## 2022-04-01 DIAGNOSIS — M21372 Foot drop, left foot: Secondary | ICD-10-CM | POA: Diagnosis present

## 2022-04-01 DIAGNOSIS — I272 Pulmonary hypertension, unspecified: Secondary | ICD-10-CM | POA: Diagnosis present

## 2022-04-01 DIAGNOSIS — J09X1 Influenza due to identified novel influenza A virus with pneumonia: Secondary | ICD-10-CM | POA: Diagnosis not present

## 2022-04-01 DIAGNOSIS — I69398 Other sequelae of cerebral infarction: Secondary | ICD-10-CM | POA: Diagnosis not present

## 2022-04-01 DIAGNOSIS — J690 Pneumonitis due to inhalation of food and vomit: Secondary | ICD-10-CM | POA: Diagnosis present

## 2022-04-01 DIAGNOSIS — Z6841 Body Mass Index (BMI) 40.0 and over, adult: Secondary | ICD-10-CM | POA: Diagnosis not present

## 2022-04-01 DIAGNOSIS — G9341 Metabolic encephalopathy: Secondary | ICD-10-CM | POA: Diagnosis not present

## 2022-04-01 DIAGNOSIS — T17890A Other foreign object in other parts of respiratory tract causing asphyxiation, initial encounter: Secondary | ICD-10-CM | POA: Diagnosis not present

## 2022-04-01 DIAGNOSIS — Z66 Do not resuscitate: Secondary | ICD-10-CM | POA: Diagnosis present

## 2022-04-01 DIAGNOSIS — G894 Chronic pain syndrome: Secondary | ICD-10-CM | POA: Insufficient documentation

## 2022-04-01 DIAGNOSIS — Z7401 Bed confinement status: Secondary | ICD-10-CM

## 2022-04-01 DIAGNOSIS — F419 Anxiety disorder, unspecified: Secondary | ICD-10-CM | POA: Diagnosis not present

## 2022-04-01 DIAGNOSIS — Z888 Allergy status to other drugs, medicaments and biological substances status: Secondary | ICD-10-CM

## 2022-04-01 DIAGNOSIS — F319 Bipolar disorder, unspecified: Secondary | ICD-10-CM | POA: Diagnosis present

## 2022-04-01 DIAGNOSIS — J9622 Acute and chronic respiratory failure with hypercapnia: Secondary | ICD-10-CM | POA: Diagnosis present

## 2022-04-01 DIAGNOSIS — J9692 Respiratory failure, unspecified with hypercapnia: Secondary | ICD-10-CM | POA: Insufficient documentation

## 2022-04-01 DIAGNOSIS — Z515 Encounter for palliative care: Secondary | ICD-10-CM | POA: Diagnosis not present

## 2022-04-01 DIAGNOSIS — J9601 Acute respiratory failure with hypoxia: Principal | ICD-10-CM

## 2022-04-01 DIAGNOSIS — E876 Hypokalemia: Secondary | ICD-10-CM | POA: Diagnosis not present

## 2022-04-01 DIAGNOSIS — Z79899 Other long term (current) drug therapy: Secondary | ICD-10-CM

## 2022-04-01 DIAGNOSIS — Z5329 Procedure and treatment not carried out because of patient's decision for other reasons: Secondary | ICD-10-CM | POA: Diagnosis not present

## 2022-04-01 DIAGNOSIS — E874 Mixed disorder of acid-base balance: Secondary | ICD-10-CM | POA: Diagnosis not present

## 2022-04-01 DIAGNOSIS — M549 Dorsalgia, unspecified: Secondary | ICD-10-CM | POA: Diagnosis present

## 2022-04-01 DIAGNOSIS — Z1152 Encounter for screening for COVID-19: Secondary | ICD-10-CM

## 2022-04-01 DIAGNOSIS — F32A Depression, unspecified: Secondary | ICD-10-CM | POA: Diagnosis present

## 2022-04-01 DIAGNOSIS — Z7901 Long term (current) use of anticoagulants: Secondary | ICD-10-CM

## 2022-04-01 DIAGNOSIS — Z881 Allergy status to other antibiotic agents status: Secondary | ICD-10-CM

## 2022-04-01 DIAGNOSIS — I6930 Unspecified sequelae of cerebral infarction: Secondary | ICD-10-CM

## 2022-04-01 DIAGNOSIS — Z8673 Personal history of transient ischemic attack (TIA), and cerebral infarction without residual deficits: Secondary | ICD-10-CM | POA: Diagnosis not present

## 2022-04-01 DIAGNOSIS — Z86711 Personal history of pulmonary embolism: Secondary | ICD-10-CM

## 2022-04-01 DIAGNOSIS — Z22322 Carrier or suspected carrier of Methicillin resistant Staphylococcus aureus: Secondary | ICD-10-CM

## 2022-04-01 DIAGNOSIS — G4733 Obstructive sleep apnea (adult) (pediatric): Secondary | ICD-10-CM | POA: Diagnosis present

## 2022-04-01 DIAGNOSIS — J101 Influenza due to other identified influenza virus with other respiratory manifestations: Secondary | ICD-10-CM | POA: Diagnosis present

## 2022-04-01 DIAGNOSIS — Z7189 Other specified counseling: Secondary | ICD-10-CM | POA: Diagnosis not present

## 2022-04-01 DIAGNOSIS — Z7982 Long term (current) use of aspirin: Secondary | ICD-10-CM

## 2022-04-01 HISTORY — DX: Heart failure, unspecified: I50.9

## 2022-04-01 HISTORY — DX: Unspecified asthma, uncomplicated: J45.909

## 2022-04-01 LAB — COMPREHENSIVE METABOLIC PANEL
ALT: 7 U/L (ref 0–44)
AST: 21 U/L (ref 15–41)
Albumin: 2.4 g/dL — ABNORMAL LOW (ref 3.5–5.0)
Alkaline Phosphatase: 44 U/L (ref 38–126)
Anion gap: 11 (ref 5–15)
BUN: 15 mg/dL (ref 6–20)
CO2: 43 mmol/L — ABNORMAL HIGH (ref 22–32)
Calcium: 8.3 mg/dL — ABNORMAL LOW (ref 8.9–10.3)
Chloride: 85 mmol/L — ABNORMAL LOW (ref 98–111)
Creatinine, Ser: 0.38 mg/dL — ABNORMAL LOW (ref 0.44–1.00)
GFR, Estimated: >60 mL/min (ref >60)
Glucose, Bld: 139 mg/dL — ABNORMAL HIGH (ref 70–99)
Potassium: 4.6 mmol/L (ref 3.5–5.1)
Sodium: 139 mmol/L (ref 135–145)
Total Bilirubin: 0.9 mg/dL (ref 0.3–1.2)
Total Protein: 7 g/dL (ref 6.5–8.1)

## 2022-04-01 LAB — RESP PANEL BY RT-PCR (RSV, FLU A&B, COVID)  RVPGX2
Influenza A by PCR: POSITIVE — AB
Influenza B by PCR: NEGATIVE
Resp Syncytial Virus by PCR: NEGATIVE
SARS Coronavirus 2 by RT PCR: NEGATIVE

## 2022-04-01 LAB — CBC WITH DIFFERENTIAL/PLATELET
Abs Immature Granulocytes: 0.1 10*3/uL — ABNORMAL HIGH (ref 0.00–0.07)
Basophils Absolute: 0.1 10*3/uL (ref 0.0–0.1)
Basophils Relative: 0 %
Eosinophils Absolute: 0 10*3/uL (ref 0.0–0.5)
Eosinophils Relative: 0 %
HCT: 37.6 % (ref 36.0–46.0)
Hemoglobin: 11 g/dL — ABNORMAL LOW (ref 12.0–15.0)
Immature Granulocytes: 1 %
Lymphocytes Relative: 3 %
Lymphs Abs: 0.6 10*3/uL — ABNORMAL LOW (ref 0.7–4.0)
MCH: 26.1 pg (ref 26.0–34.0)
MCHC: 29.3 g/dL — ABNORMAL LOW (ref 30.0–36.0)
MCV: 89.1 fL (ref 80.0–100.0)
Monocytes Absolute: 1.1 10*3/uL — ABNORMAL HIGH (ref 0.1–1.0)
Monocytes Relative: 6 %
Neutro Abs: 16.6 10*3/uL — ABNORMAL HIGH (ref 1.7–7.7)
Neutrophils Relative %: 90 %
Platelets: 146 10*3/uL — ABNORMAL LOW (ref 150–400)
RBC: 4.22 MIL/uL (ref 3.87–5.11)
RDW: 15.9 % — ABNORMAL HIGH (ref 11.5–15.5)
WBC: 18.4 10*3/uL — ABNORMAL HIGH (ref 4.0–10.5)
nRBC: 0 % (ref 0.0–0.2)

## 2022-04-01 LAB — TROPONIN I (HIGH SENSITIVITY): Troponin I (High Sensitivity): 21 ng/L — ABNORMAL HIGH (ref <18)

## 2022-04-01 LAB — BLOOD GAS, VENOUS
Acid-Base Excess: 22.3 mmol/L — ABNORMAL HIGH (ref 0.0–2.0)
Bicarbonate: 54 mmol/L — ABNORMAL HIGH (ref 20.0–28.0)
O2 Saturation: 94.3 %
Patient temperature: 37
pCO2, Ven: 100 mmHg (ref 44–60)
pH, Ven: 7.34 (ref 7.25–7.43)
pO2, Ven: 73 mmHg — ABNORMAL HIGH (ref 32–45)

## 2022-04-01 LAB — BRAIN NATRIURETIC PEPTIDE: B Natriuretic Peptide: 129.3 pg/mL — ABNORMAL HIGH (ref 0.0–100.0)

## 2022-04-01 LAB — PROCALCITONIN: Procalcitonin: 0.16 ng/mL

## 2022-04-01 LAB — LACTIC ACID, PLASMA
Lactic Acid, Venous: 0.7 mmol/L (ref 0.5–1.9)
Lactic Acid, Venous: 1.3 mmol/L (ref 0.5–1.9)

## 2022-04-01 MED ORDER — IPRATROPIUM BROMIDE 0.02 % IN SOLN
0.5000 mg | Freq: Four times a day (QID) | RESPIRATORY_TRACT | Status: DC
  Administered 2022-04-01: 0.5 mg via RESPIRATORY_TRACT
  Filled 2022-04-01: qty 2.5

## 2022-04-01 MED ORDER — TRAMADOL HCL 50 MG PO TABS
50.0000 mg | ORAL_TABLET | Freq: Four times a day (QID) | ORAL | Status: DC | PRN
  Administered 2022-04-02 – 2022-04-03 (×2): 50 mg via ORAL
  Filled 2022-04-01 (×2): qty 1

## 2022-04-01 MED ORDER — LORATADINE 10 MG PO TABS
10.0000 mg | ORAL_TABLET | Freq: Every day | ORAL | Status: DC
  Administered 2022-04-03 – 2022-04-18 (×15): 10 mg via ORAL
  Filled 2022-04-01 (×16): qty 1

## 2022-04-01 MED ORDER — PANTOPRAZOLE SODIUM 20 MG PO TBEC
20.0000 mg | DELAYED_RELEASE_TABLET | Freq: Every day | ORAL | Status: DC
  Filled 2022-04-01: qty 1

## 2022-04-01 MED ORDER — METHYLPREDNISOLONE SODIUM SUCC 125 MG IJ SOLR
125.0000 mg | Freq: Once | INTRAMUSCULAR | Status: AC
Start: 2022-04-01 — End: 2022-04-01
  Administered 2022-04-01: 125 mg via INTRAVENOUS
  Filled 2022-04-01: qty 2

## 2022-04-01 MED ORDER — OSELTAMIVIR PHOSPHATE 75 MG PO CAPS
75.0000 mg | ORAL_CAPSULE | Freq: Two times a day (BID) | ORAL | Status: AC
  Administered 2022-04-01 – 2022-04-06 (×8): 75 mg via ORAL
  Filled 2022-04-01 (×13): qty 1

## 2022-04-01 MED ORDER — IBUPROFEN 400 MG PO TABS
400.0000 mg | ORAL_TABLET | Freq: Four times a day (QID) | ORAL | Status: DC | PRN
  Administered 2022-04-05 – 2022-04-13 (×9): 400 mg via ORAL
  Filled 2022-04-01 (×10): qty 1

## 2022-04-01 MED ORDER — ATORVASTATIN CALCIUM 20 MG PO TABS
40.0000 mg | ORAL_TABLET | Freq: Every day | ORAL | Status: DC
  Administered 2022-04-01 – 2022-04-17 (×16): 40 mg via ORAL
  Filled 2022-04-01 (×16): qty 2

## 2022-04-01 MED ORDER — ALBUTEROL SULFATE (2.5 MG/3ML) 0.083% IN NEBU
2.5000 mg | INHALATION_SOLUTION | RESPIRATORY_TRACT | Status: DC | PRN
  Administered 2022-04-07 – 2022-04-08 (×2): 2.5 mg via RESPIRATORY_TRACT
  Filled 2022-04-01 (×2): qty 3

## 2022-04-01 MED ORDER — DIVALPROEX SODIUM 125 MG PO CSDR
250.0000 mg | DELAYED_RELEASE_CAPSULE | Freq: Every day | ORAL | Status: DC
  Administered 2022-04-05 – 2022-04-17 (×13): 250 mg via ORAL
  Filled 2022-04-01 (×17): qty 2

## 2022-04-01 MED ORDER — IPRATROPIUM-ALBUTEROL 0.5-2.5 (3) MG/3ML IN SOLN
3.0000 mL | Freq: Once | RESPIRATORY_TRACT | Status: AC
Start: 2022-04-01 — End: 2022-04-01
  Administered 2022-04-01: 3 mL via RESPIRATORY_TRACT
  Filled 2022-04-01: qty 3

## 2022-04-01 MED ORDER — SODIUM CHLORIDE 0.9 % IV SOLN
500.0000 mg | Freq: Once | INTRAVENOUS | Status: AC
  Administered 2022-04-01: 500 mg via INTRAVENOUS
  Filled 2022-04-01: qty 5

## 2022-04-01 MED ORDER — DOCUSATE SODIUM 100 MG PO CAPS
100.0000 mg | ORAL_CAPSULE | Freq: Every day | ORAL | Status: DC
  Administered 2022-04-03 – 2022-04-18 (×8): 100 mg via ORAL
  Filled 2022-04-01 (×14): qty 1

## 2022-04-01 MED ORDER — GUAIFENESIN ER 600 MG PO TB12
600.0000 mg | ORAL_TABLET | Freq: Two times a day (BID) | ORAL | Status: DC
  Administered 2022-04-01 – 2022-04-18 (×29): 600 mg via ORAL
  Filled 2022-04-01 (×31): qty 1

## 2022-04-01 MED ORDER — SODIUM CHLORIDE 0.9 % IV SOLN
2.0000 g | Freq: Once | INTRAVENOUS | Status: AC
  Administered 2022-04-01: 2 g via INTRAVENOUS
  Filled 2022-04-01: qty 20

## 2022-04-01 MED ORDER — IPRATROPIUM-ALBUTEROL 0.5-2.5 (3) MG/3ML IN SOLN
3.0000 mL | Freq: Once | RESPIRATORY_TRACT | Status: AC
  Administered 2022-04-01: 3 mL via RESPIRATORY_TRACT
  Filled 2022-04-01: qty 3

## 2022-04-01 MED ORDER — IOHEXOL 350 MG/ML SOLN
100.0000 mL | Freq: Once | INTRAVENOUS | Status: AC | PRN
  Administered 2022-04-02: 75 mL via INTRAVENOUS

## 2022-04-01 MED ORDER — SODIUM CHLORIDE 0.9 % IV BOLUS
500.0000 mL | Freq: Once | INTRAVENOUS | Status: AC
  Administered 2022-04-01: 500 mL via INTRAVENOUS

## 2022-04-01 MED ORDER — DIVALPROEX SODIUM 500 MG PO DR TAB
500.0000 mg | DELAYED_RELEASE_TABLET | Freq: Two times a day (BID) | ORAL | Status: DC
  Administered 2022-04-01 – 2022-04-18 (×31): 500 mg via ORAL
  Filled 2022-04-01 (×34): qty 1

## 2022-04-01 MED ORDER — ENOXAPARIN SODIUM 60 MG/0.6ML IJ SOSY
60.0000 mg | PREFILLED_SYRINGE | Freq: Two times a day (BID) | INTRAMUSCULAR | Status: DC
  Administered 2022-04-01 – 2022-04-18 (×33): 60 mg via SUBCUTANEOUS
  Filled 2022-04-01 (×33): qty 0.6

## 2022-04-01 MED ORDER — ACETAMINOPHEN 325 MG PO TABS
650.0000 mg | ORAL_TABLET | Freq: Three times a day (TID) | ORAL | Status: DC | PRN
  Administered 2022-04-07 – 2022-04-15 (×4): 650 mg via ORAL
  Filled 2022-04-01 (×4): qty 2

## 2022-04-01 MED ORDER — LORAZEPAM 2 MG/ML IJ SOLN
0.5000 mg | INTRAMUSCULAR | Status: AC | PRN
  Administered 2022-04-02: 0.5 mg via INTRAVENOUS
  Filled 2022-04-01: qty 1

## 2022-04-01 MED ORDER — ACETAMINOPHEN 500 MG PO TABS
1000.0000 mg | ORAL_TABLET | Freq: Once | ORAL | Status: AC
  Administered 2022-04-01: 1000 mg via ORAL
  Filled 2022-04-01: qty 2

## 2022-04-01 MED ORDER — SODIUM CHLORIDE 0.9 % IV BOLUS
1000.0000 mL | Freq: Once | INTRAVENOUS | Status: AC
  Administered 2022-04-01: 1000 mL via INTRAVENOUS

## 2022-04-01 MED ORDER — RISPERIDONE 1 MG PO TABS
0.5000 mg | ORAL_TABLET | Freq: Two times a day (BID) | ORAL | Status: DC
  Administered 2022-04-01 – 2022-04-04 (×4): 0.5 mg via ORAL
  Filled 2022-04-01 (×4): qty 1

## 2022-04-01 MED ORDER — ALBUTEROL SULFATE (2.5 MG/3ML) 0.083% IN NEBU
2.5000 mg | INHALATION_SOLUTION | Freq: Four times a day (QID) | RESPIRATORY_TRACT | Status: DC
  Administered 2022-04-01: 2.5 mg via RESPIRATORY_TRACT
  Filled 2022-04-01: qty 3

## 2022-04-01 MED ORDER — ASPIRIN 81 MG PO CHEW
81.0000 mg | CHEWABLE_TABLET | Freq: Every day | ORAL | Status: DC
  Administered 2022-04-03 – 2022-04-18 (×15): 81 mg via ORAL
  Filled 2022-04-01 (×16): qty 1

## 2022-04-01 MED ORDER — SERTRALINE HCL 50 MG PO TABS
100.0000 mg | ORAL_TABLET | Freq: Every morning | ORAL | Status: DC
  Administered 2022-04-03 – 2022-04-04 (×2): 100 mg via ORAL
  Filled 2022-04-01 (×3): qty 2

## 2022-04-01 MED ORDER — PREGABALIN 50 MG PO CAPS
50.0000 mg | ORAL_CAPSULE | Freq: Two times a day (BID) | ORAL | Status: DC
  Administered 2022-04-01 – 2022-04-10 (×16): 50 mg via ORAL
  Filled 2022-04-01 (×17): qty 1

## 2022-04-01 NOTE — ED Notes (Signed)
RT to bedside, placing pt on bipap at this time

## 2022-04-01 NOTE — ED Provider Notes (Signed)
[facility] Provider Note    Event Date/Time   First MD Initiated Contact with Patient 04/01/22 1706     (approximate)   History   Shortness of Breath   HPI  Sharon Black is a 51 y.o. female   with past medical history of morbid obesity, prior CVA, chronic hypoxic respiratory failure on 4 L oxygen, here with shortness of breath.  Patient states that for the last 2 and half days, she has had progressive worsening cough and shortness of breath.  She has had fatigue and chills.  She has had decreased appetite.  No nausea or vomiting.  No chest pain.  She got increasingly short of breath throughout the day today so was brought in via EMS.  EMS arrival patient was profoundly hypoxic with increased work of breathing.  She has had mild improvement after nebulizers.  No other complaints.      Physical Exam   Triage Vital Signs: ED Triage Vitals [04/01/22 1659]  Enc Vitals Group     BP 108/72     Pulse Rate (!) 123     Resp 20     Temp 99 F (37.2 C)     Temp Source Oral     SpO2 (!) 86 %     Weight (!) 308 lb 10.3 oz (140 kg)     Height 5\' 3"  (1.6 m)     Head Circumference      Peak Flow      Pain Score      Pain Loc      Pain Edu?      Excl. in GC?     Most recent vital signs: Vitals:   04/01/22 1857 04/01/22 1900  BP:  99/65  Pulse: (!) 120 (!) 121  Resp:  (!) 21  Temp:    SpO2: 96% 94%     General: Awake uncomfortable, in mild respiratory distress. CV:  Good peripheral perfusion.  Tachycardic, regular. Resp:  Significant increased work of breathing with diminished aeration and bilateral wheezes. Abd:  No distention.  Other:  Trace lower extremity edema.   ED Results / Procedures / Treatments   Labs (all labs ordered are listed, but only abnormal results are displayed) Labs Reviewed  RESP PANEL BY RT-PCR (RSV, FLU A&B, COVID)  RVPGX2 - Abnormal; Notable for the following components:      Result Value   Influenza A by PCR  POSITIVE (*)    All other components within normal limits  CBC WITH DIFFERENTIAL/PLATELET - Abnormal; Notable for the following components:   WBC 18.4 (*)    Hemoglobin 11.0 (*)    MCHC 29.3 (*)    RDW 15.9 (*)    Platelets 146 (*)    Neutro Abs 16.6 (*)    Lymphs Abs 0.6 (*)    Monocytes Absolute 1.1 (*)    Abs Immature Granulocytes 0.10 (*)    All other components within normal limits  COMPREHENSIVE METABOLIC PANEL - Abnormal; Notable for the following components:   Chloride 85 (*)    CO2 43 (*)    Glucose, Bld 139 (*)    Creatinine, Ser 0.38 (*)    Calcium 8.3 (*)    Albumin 2.4 (*)    All other components within normal limits  BLOOD GAS, VENOUS - Abnormal; Notable for the following components:   pCO2, Ven 100 (*)    pO2, Ven 73 (*)    Bicarbonate 54.0 (*)    Acid-Base Excess  22.3 (*)    All other components within normal limits  TROPONIN I (HIGH SENSITIVITY) - Abnormal; Notable for the following components:   Troponin I (High Sensitivity) 21 (*)    All other components within normal limits  CULTURE, BLOOD (ROUTINE X 2)  CULTURE, BLOOD (ROUTINE X 2)  LACTIC ACID, PLASMA  PROCALCITONIN  BRAIN NATRIURETIC PEPTIDE  LACTIC ACID, PLASMA     EKG Sinus tachycardia, ventricular rate 124.  PR 161, QRS 83, QTc 454.  No acute ST elevations or depressions.  No acute evidence of acute ischemia or infarct.   RADIOLOGY Chest x-ray: Moderate right pleural effusion with right basilar atelectasis   I also independently reviewed and agree with radiologist interpretations.   PROCEDURES:  Critical Care performed: Yes, see critical care procedure note(s)  .Critical Care  Performed by: Shaune Pollack, MD Authorized by: Shaune Pollack, MD   Critical care provider statement:    Critical care time (minutes):  30   Critical care time was exclusive of:  Separately billable procedures and treating other patients   Critical care was necessary to treat or prevent imminent or  life-threatening deterioration of the following conditions:  Cardiac failure, circulatory failure and respiratory failure   Critical care was time spent personally by me on the following activities:  Development of treatment plan with patient or surrogate, discussions with consultants, evaluation of patient's response to treatment, examination of patient, ordering and review of laboratory studies, ordering and review of radiographic studies, ordering and performing treatments and interventions, pulse oximetry, re-evaluation of patient's condition and review of old charts   Care discussed with: admitting provider      MEDICATIONS ORDERED IN ED: Medications  cefTRIAXone (ROCEPHIN) 2 g in sodium chloride 0.9 % 100 mL IVPB (2 g Intravenous New Bag/Given 04/01/22 1941)  azithromycin (ZITHROMAX) 500 mg in sodium chloride 0.9 % 250 mL IVPB (has no administration in time range)  oseltamivir (TAMIFLU) capsule 75 mg (has no administration in time range)  methylPREDNISolone sodium succinate (SOLU-MEDROL) 125 mg/2 mL injection 125 mg (125 mg Intravenous Given 04/01/22 1725)  ipratropium-albuterol (DUONEB) 0.5-2.5 (3) MG/3ML nebulizer solution 3 mL (3 mLs Nebulization Given 04/01/22 1715)  ipratropium-albuterol (DUONEB) 0.5-2.5 (3) MG/3ML nebulizer solution 3 mL (3 mLs Nebulization Given 04/01/22 1715)  sodium chloride 0.9 % bolus 500 mL (500 mLs Intravenous New Bag/Given 04/01/22 1728)  ipratropium-albuterol (DUONEB) 0.5-2.5 (3) MG/3ML nebulizer solution 3 mL (3 mLs Nebulization Given 04/01/22 1714)  acetaminophen (TYLENOL) tablet 1,000 mg (1,000 mg Oral Given 04/01/22 1719)     IMPRESSION / MDM / ASSESSMENT AND PLAN / ED COURSE  I reviewed the triage vital signs and the nursing notes.                              Differential diagnosis includes, but is not limited to, influenza, asthma/reactive airway disease, pneumonia, PE, CHF, ACS  Patient's presentation is most consistent with acute presentation  with potential threat to life or bodily function.  The patient is on the cardiac monitor to evaluate for evidence of arrhythmia and/or significant heart rate changes  51 year old female with history of chronic hypoxic respiratory failure, morbid obesity, here with shortness of breath.  Patient arrives tachypneic and dyspneic.  She was placed on BiPAP for her work of breathing and hypoxia with significant improvement.  Chest x-ray shows persistent right basilar atelectasis with small effusion.  CBC with significant leukocytosis of 18,000.  CMP largely unremarkable,  bicarb 43 from chronic hypercapnia.  Blood gas with pCO2 of 100 but pH 7.34.  She is influenza positive.  Patient given IV steroids, DuoNebs, Tamiflu and empiric antibiotics in the event of possible right pleural effusion with basilar atelectasis.  Will admit to hospitalist service.   FINAL CLINICAL IMPRESSION(S) / ED DIAGNOSES   Final diagnoses:  Acute respiratory failure with hypoxemia (HCC)  Influenza A     Rx / DC Orders   ED Discharge Orders     None        Note:  This document was prepared using Dragon voice recognition software and may include unintentional dictation errors.   Shaune Pollack, MD 04/01/22 [phone]

## 2022-04-01 NOTE — ED Notes (Signed)
[place] notified on patients refusal of bipap and also saturations mid-high 80's on maxed out high flow [place].

## 2022-04-01 NOTE — ED Notes (Signed)
Lab called for blood cultures.

## 2022-04-01 NOTE — H&P (Signed)
History and Physical    Patient: Sharon Black [id number]:[id number] DOB: 17-Dec-1970 DOA: 04/01/2022 DOS: the patient was seen and examined on 04/01/2022 PCP: Sherol Dade, DO  Patient coming from: SNF  Chief Complaint:  Chief Complaint  Patient presents with   Shortness of Breath   HPI: Sharon Black is a 51 y.o. female with medical history significant of morbid obesity with BMI greater than 50, chronic respiratory failure on 4 L/min of oxygen daily, history of pulm emboli, chronic asthma, pulmonary hypertension, history of CVA with residual left-sided weakness.  Patient was sent to the emergency room from [facility] for evaluation of shortness of breath.  Patient reports progressive worsening shortness of breath over the past couple of days.  Symptoms administrated with generalized weakness and decreased appetite.  She admits to occasional coughing spells.  Despite use of inhalers at the facility, patient did not show any improvement.  Today, his symptoms worsened, hence EMS was called.  Patient was reportedly found to be significantly hypoxic, requiring nonrebreather mask for oxygen support.  She was brought to the emergency room to be further evaluated.  Patient is a poor historian at this time.  She was unable to state if she had did have fever and chills.  Patient required multiple rounds of bronchodilators and steroids.  Patient is currently on BiPAP after ABG revealed hypercarbia.  BiPAP  settings18/8.  Further workup revealed influenza A infection.  Review of Systems: History was limited due to patient condition.  As mentioned in the history of present illness. All other systems reviewed and are negative. Past Medical History:  Diagnosis Date   Asthma    CHF (congestive heart failure) (HCC)    Morbid obesity (HCC)    Pulmonary emboli (HCC)    Pulmonary hypertension (HCC)    History reviewed. No pertinent surgical history. Social History:  has no history on file for  tobacco use, alcohol use, and drug use.  Allergies  Allergen Reactions   Sulphamethoxydiazine Diarrhea   Tetracyclines & Related Diarrhea    History reviewed. No pertinent family history.  Prior to Admission medications   Medication Sig Start Date End Date Taking? Authorizing Provider  acetaminophen (TYLENOL) 650 MG CR tablet Take 650 mg by mouth every 8 (eight) hours as needed for pain.    [provider]  ASPIRIN LOW DOSE 81 MG chewable tablet Chew 81 mg by mouth daily. 07/11/21   [provider]  atorvastatin (LIPITOR) 40 MG tablet Take 40 mg by mouth at bedtime. 07/25/21   [provider]  calcium carbonate (TUMS - DOSED IN MG ELEMENTAL CALCIUM) 500 MG chewable tablet Chew 1 tablet by mouth in the morning and at bedtime. 0800 & 1600 07/11/21   [provider]  divalproex (DEPAKOTE SPRINKLE) 125 MG capsule Take 250 mg by mouth daily at 12 noon. 07/25/21   [provider]  divalproex (DEPAKOTE) 500 MG DR tablet Take 500 mg by mouth 2 (two) times daily. 0800 & 1600 07/25/21   [provider]  docusate sodium (COLACE) 100 MG capsule Take 100 mg by mouth daily. 07/11/21   [provider]  enoxaparin (LOVENOX) 60 MG/0.6ML injection Inject 60 mg into the skin in the morning and at bedtime. 07/24/21   [provider]  fexofenadine (ALLEGRA) 60 MG tablet Take 60 mg by mouth daily. 07/11/21   [provider]  furosemide (LASIX) 40 MG tablet Take 1 tablet (40 mg total) by mouth daily. 08/01/21   Wouk, Wilfred Curtis,  MD  ibuprofen (ADVIL) 200 MG tablet Take 400 mg by mouth every 6 (six) hours as needed.    [provider]  ipratropium-albuterol (DUONEB) 0.5-2.5 (3) MG/3ML SOLN Take 3 mLs by nebulization every 12 (twelve) hours as needed. 04/23/21   [provider]  lidocaine (LIDODERM) 5 % Place 1 patch onto the skin daily. Remove & Discard patch within 12 hours or as directed by MD    [provider]   ondansetron (ZOFRAN) 4 MG tablet Take 4 mg by mouth every 6 (six) hours as needed. 07/24/21   [provider]  pantoprazole (PROTONIX) 20 MG tablet Take 20 mg by mouth daily. 07/25/21   [provider]  polyethylene glycol (MIRALAX / GLYCOLAX) 17 g packet Take 17 g by mouth daily as needed.    [provider]  pregabalin (LYRICA) 50 MG capsule Take 50 mg by mouth 2 (two) times daily. 0800 & 1600 07/22/21   [provider]  REFRESH LIQUIGEL 1 % GEL Apply 1 drop to eye in the morning and at bedtime. 1000 & 1800 07/30/21   [provider]  risperiDONE (RISPERDAL) 0.5 MG tablet Take 0.5 mg by mouth 2 (two) times daily. 0800 & 1600 07/25/21   [provider]  sertraline (ZOLOFT) 100 MG tablet Take 100 mg by mouth in the morning. 07/25/21   [provider]  traMADol (ULTRAM) 50 MG tablet Take 1 tablet by mouth every 8 (eight) hours as needed. 07/02/21   [provider]    Physical Exam: Vitals:   04/01/22 1715 04/01/22 1857 04/01/22 1900 04/01/22 2029  BP:   99/65 96/81  Pulse: (!) 122 (!) 120 (!) 121 (!) 114  Resp: (!) 23  (!) 21 (!) 21  Temp:      TempSrc:      SpO2: 93% 96% 94% 93%  Weight:      Height:        Physical Exam Vitals and nursing note reviewed.  Constitutional:      Appearance: She is morbidly obese.     Comments: Chronically ill-appearing.  Dry mucous membranes,unable to assess oral mucosa at this time due to mask HENT:     Head: Normocephalic and atraumatic.     Mouth/Throat:     Eyes:     Difficult to assess Cardiovascular:     Rate and Rhythm: Normal rate and regular rhythm.  Pulmonary:     Effort: Poor Pulmonary effort.Patient has bipap mask in place    Breath sounds: Diminshed breath sounds.  Abdominal:     General: Bowel sounds are normal.     Palpations: Abdomen is soft.     Comments: Central obesity  Musculoskeletal:     Cervical back: Normal range of motion and neck supple.  Bilateral  lower extremity: Edema present.     Comments: Decreased range of motion left leg.  Contracted  Skin:    General: dry skin in lower extremities Neurological:     Mental Status: She is alert.     Comments: Left-sided hemiparesis from prior stroke  Psychiatric:        Mood and Affect: Labile. Limited eval due to her condition      Behavior: Behavior normal.   Data Reviewed: VBG shows pH of 7.34, pCO2 of 100, sodium 139, potassium 4.6, chloride 85, bicarb 43, glucose 139, BUN 15, creatinine 0.38.  Troponin 21 WBC 18.4, hemoglobin 11, hematocrit 37.6, platelet count is 146 .  Influenza A+.  COVID-negative.  Chest x-ray significant for right pleural effusion with associated right basilar atelectasis, cardiomegaly.  Assessment and Plan:  51 year old morbidly obese female with history of chronic respiratory failure on oxygen supplementation.  History of CVA with residual weakness, pulmonary hypertension due to chronic respiratory failure.  Admitted on account of worsening shortness of breath.  Patient tested positive for influenza A.  Chest x-ray also significant for moderate right-sided pleural effusion.  Acute hypoxic/hypercarbic respiratory failure: Multifactorial.  Likely triggered by influenza A infection.  Patient also have evidence of right-sided pleural effusion.  Likely may be contributory.  Patient has been supplemented with BiPAP for respiratory support.  Influenza A infection: Patient was initiated on Tamiflu for treatment.  Guaifenesin for symptom management.   Moderate right-sided pleural effusion: New finding.  Further evaluation with CT angiogram of the chest may be warranted.  Pulmonology consult in AM.  Patient may benefit from thoracentesis.  Morbid obesity with BMI greater than 50.  Possible underlying obesity hypoventilation syndrome and obstructive sleep apnea.  History of CVA with residual deficit Patient has a history of CVA with left-sided hemiparesis At baseline she is  bed bound. PTOT to assess.  Depression: Bipolar Disorder:Stable Continue sertraline, Depakote and risperidone   Advance Care Planning:   Code Status: Full Code   Consults: Pulmonology  Family Communication: No family at bedside  Severity of Illness: The appropriate patient status for this patient is INPATIENT. Inpatient status is judged to be reasonable and necessary in order to provide the required intensity of service to ensure the patient's safety. The patient's presenting symptoms, physical exam findings, and initial radiographic and laboratory data in the context of their chronic comorbidities is felt to place them at high risk for further clinical deterioration. Furthermore, it is not anticipated that the patient will be medically stable for discharge from the hospital within 2 midnights of admission.   * I certify that at the point of admission it is my clinical judgment that the patient will require inpatient hospital care spanning beyond 2 midnights from the point of admission due to high intensity of service, high risk for further deterioration and high frequency of surveillance required.*  Author: Lilia Pro, MD 04/01/2022 9:03 PM  For on call review [email or web address].

## 2022-04-01 NOTE — Progress Notes (Signed)
Late entry: Noticed patient had removed bipap mask. She states the mask is chocking her so I placed on cool hfnc. Saturation approx 89%

## 2022-04-01 NOTE — ED Triage Notes (Signed)
Pt arrives via EMS from [facility] w/ c/o SOB since Friday. EMS reports pt was 87% on their 15L NRB, refused cpap, refused IV. Hx CHF  Has DNR  120 hr 96.5 oral 90/60

## 2022-04-01 NOTE — Progress Notes (Signed)
       CROSS COVER NOTE  NAME: Sharon Black MRN: [id number] DOB : 1970/06/13 ATTENDING PHYSICIAN: Lilia Pro, MD    Date of Service   04/01/2022   HPI/Events of Note   Message received from RN reporting M(r)s Matusik is maintaining SPO2 in mid-high 80's on maxed high flow [place] and that she is continuing to refuse bipap.  Interventions   Assessment/Plan:  Heated High Flow       To reach the provider On-Call:   7AM- 7PM see care teams to locate the attending and reach out to them via [email or web address]. 7PM-7AM contact night-coverage If you still have difficulty reaching the appropriate provider, please page the [facility] (Director on Call) for [facility] on amion for assistance  This document was prepared using Sales executive software and may include unintentional dictation errors.  Bishop Limbo DNP, MBA, FNP-BC Nurse Practitioner [facility] Pager [phone]

## 2022-04-02 ENCOUNTER — Inpatient Hospital Stay: Payer: Medicare Other

## 2022-04-02 DIAGNOSIS — J9601 Acute respiratory failure with hypoxia: Secondary | ICD-10-CM | POA: Diagnosis not present

## 2022-04-02 DIAGNOSIS — J101 Influenza due to other identified influenza virus with other respiratory manifestations: Secondary | ICD-10-CM | POA: Diagnosis not present

## 2022-04-02 DIAGNOSIS — J09X1 Influenza due to identified novel influenza A virus with pneumonia: Secondary | ICD-10-CM

## 2022-04-02 DIAGNOSIS — J9621 Acute and chronic respiratory failure with hypoxia: Secondary | ICD-10-CM | POA: Diagnosis present

## 2022-04-02 DIAGNOSIS — J9622 Acute and chronic respiratory failure with hypercapnia: Secondary | ICD-10-CM | POA: Diagnosis not present

## 2022-04-02 LAB — CBC
HCT: 34.5 % — ABNORMAL LOW (ref 36.0–46.0)
Hemoglobin: 10.1 g/dL — ABNORMAL LOW (ref 12.0–15.0)
MCH: 26.1 pg (ref 26.0–34.0)
MCHC: 29.3 g/dL — ABNORMAL LOW (ref 30.0–36.0)
MCV: 89.1 fL (ref 80.0–100.0)
Platelets: 156 10*3/uL (ref 150–400)
RBC: 3.87 MIL/uL (ref 3.87–5.11)
RDW: 15.3 % (ref 11.5–15.5)
WBC: 11.8 10*3/uL — ABNORMAL HIGH (ref 4.0–10.5)
nRBC: 0 % (ref 0.0–0.2)

## 2022-04-02 LAB — COMPREHENSIVE METABOLIC PANEL
ALT: 6 U/L (ref 0–44)
AST: 13 U/L — ABNORMAL LOW (ref 15–41)
Albumin: 2.2 g/dL — ABNORMAL LOW (ref 3.5–5.0)
Alkaline Phosphatase: 46 U/L (ref 38–126)
Anion gap: 9 (ref 5–15)
BUN: 17 mg/dL (ref 6–20)
CO2: 42 mmol/L — ABNORMAL HIGH (ref 22–32)
Calcium: 8 mg/dL — ABNORMAL LOW (ref 8.9–10.3)
Chloride: 92 mmol/L — ABNORMAL LOW (ref 98–111)
Creatinine, Ser: 0.32 mg/dL — ABNORMAL LOW (ref 0.44–1.00)
GFR, Estimated: >60 mL/min (ref >60)
Glucose, Bld: 126 mg/dL — ABNORMAL HIGH (ref 70–99)
Potassium: 4.1 mmol/L (ref 3.5–5.1)
Sodium: 143 mmol/L (ref 135–145)
Total Bilirubin: 0.3 mg/dL (ref 0.3–1.2)
Total Protein: 6.5 g/dL (ref 6.5–8.1)

## 2022-04-02 LAB — BLOOD GAS, ARTERIAL
Acid-Base Excess: 19.2 mmol/L — ABNORMAL HIGH (ref 0.0–2.0)
Bicarbonate: 50.2 mmol/L — ABNORMAL HIGH (ref 20.0–28.0)
Delivery systems: POSITIVE
Expiratory PAP: 8 cmH2O
FIO2: 100 %
Inspiratory PAP: 12 cmH2O
O2 Saturation: 97.1 %
Patient temperature: 37
RATE: 10 resp/min
pCO2 arterial: 93 mmHg (ref 32–48)
pH, Arterial: 7.34 — ABNORMAL LOW (ref 7.35–7.45)
pO2, Arterial: 80 mmHg — ABNORMAL LOW (ref 83–108)

## 2022-04-02 LAB — CBG MONITORING, ED: Glucose-Capillary: 115 mg/dL — ABNORMAL HIGH (ref 70–99)

## 2022-04-02 LAB — PROCALCITONIN: Procalcitonin: 0.12 ng/mL

## 2022-04-02 LAB — BRAIN NATRIURETIC PEPTIDE: B Natriuretic Peptide: 108.1 pg/mL — ABNORMAL HIGH (ref 0.0–100.0)

## 2022-04-02 MED ORDER — SODIUM CHLORIDE 0.9 % IV SOLN: 2.0000 g | INTRAVENOUS | Status: DC

## 2022-04-02 MED ORDER — PANTOPRAZOLE SODIUM 40 MG IV SOLR
40.0000 mg | INTRAVENOUS | Status: DC
  Administered 2022-04-02: 40 mg via INTRAVENOUS
  Filled 2022-04-02: qty 10

## 2022-04-02 MED ORDER — METHYLPREDNISOLONE SODIUM SUCC 125 MG IJ SOLR
120.0000 mg | INTRAMUSCULAR | Status: DC
  Administered 2022-04-02: 120 mg via INTRAVENOUS
  Filled 2022-04-02: qty 2

## 2022-04-02 MED ORDER — IPRATROPIUM-ALBUTEROL 0.5-2.5 (3) MG/3ML IN SOLN
3.0000 mL | Freq: Four times a day (QID) | RESPIRATORY_TRACT | Status: DC
  Administered 2022-04-02 – 2022-04-14 (×46): 3 mL via RESPIRATORY_TRACT
  Filled 2022-04-02 (×47): qty 3

## 2022-04-02 MED ORDER — METHYLPREDNISOLONE SODIUM SUCC 125 MG IJ SOLR
80.0000 mg | INTRAMUSCULAR | Status: DC
  Administered 2022-04-03 – 2022-04-07 (×5): 80 mg via INTRAVENOUS
  Filled 2022-04-02 (×5): qty 2

## 2022-04-02 MED ORDER — SODIUM CHLORIDE 0.9 % IV BOLUS
500.0000 mL | Freq: Once | INTRAVENOUS | Status: AC
  Administered 2022-04-02: 500 mL via INTRAVENOUS

## 2022-04-02 MED ORDER — SODIUM CHLORIDE 0.9 % IV SOLN
500.0000 mg | INTRAVENOUS | Status: AC
  Administered 2022-04-02 – 2022-04-04 (×3): 500 mg via INTRAVENOUS
  Filled 2022-04-02 (×4): qty 5

## 2022-04-02 MED ORDER — SODIUM CHLORIDE 0.9 % IV SOLN
2.0000 g | INTRAVENOUS | Status: AC
  Administered 2022-04-02 – 2022-04-04 (×3): 2 g via INTRAVENOUS
  Filled 2022-04-02: qty 20
  Filled 2022-04-02: qty 2
  Filled 2022-04-02 (×2): qty 20

## 2022-04-02 MED ORDER — IPRATROPIUM-ALBUTEROL 0.5-2.5 (3) MG/3ML IN SOLN
RESPIRATORY_TRACT | Status: AC
  Administered 2022-04-02: 3 mL via RESPIRATORY_TRACT
  Filled 2022-04-02: qty 3

## 2022-04-02 NOTE — Progress Notes (Signed)
OT Cancellation Note  Patient Details Name: Sharon Black MRN: [id number] DOB: 07-Apr-1971   Cancelled Treatment:    Reason Eval/Treat Not Completed: Medical issues which prohibited therapy. OT order received and chart reviewed. Pt with RED mews score this morning and last BP of 85/59 recorded. Pt also remains on Bipap this morning secondary to respiratory distress. OT to hold today and will re-attempt when pt is able to actively participate in therapeutic intervention.   Jackquline Denmark, MS, OTR/L , CBIS ascom [phone]  04/02/22, 9:39 AM

## 2022-04-02 NOTE — Progress Notes (Signed)
Progress Note   Patient: Sharon Black PYP:[id number] DOB: January 08, 1971 DOA: 04/01/2022     1 DOS: the patient was seen and examined on 04/02/2022   Brief hospital course:  51 year old morbidly obese female with history of chronic respiratory failure on oxygen supplementation.  History of CVA with residual weakness, pulmonary hypertension due to chronic respiratory failure.  Admitted on account of worsening shortness of breath.  Patient tested positive for influenza A.  Chest x-ray also significant for moderate right-sided pleural effusion.   Assessment and Plan:  Acute hypoxic/hypercarbic respiratory failure: Multifactorial secondary to pleural effusion,collapsed lung. Likely triggered by influenza A infection.CT scan ruled out PE. CT scan concerning for right middle and lower lung collapse.Due to patients symptoms,patient is high risk of decompensation.Patient will require ICU transfer. Pulmonologist consulted. Bipap settings IPAP 18 EPAP 8. Repeat ABG in 6 hours.Pulmonology consult ordered and pending.  Moderate right-sided pleural effusion.  Likely may be contributory to acute respiratory failure.  Patient has been supplemented with BiPAP for respiratory support. Pulmonology consult in AM.  Patient may benefit from thoracentesis.  Right Lower Lobe Pneumonia: WBC elevated. IV antibiotics with ceftriaxone + azithromycin.Lactate within normal limits   Influenza A infection: Diffuse bilateral ground glass opacities.Right greater than left lower lobe. Patient was initiated on Tamiflu for treatment.  Guaifenesin for symptom management as tolerated.     History of CVA with residual deficit Patient has a history of CVA with left-sided hemiparesis.At baseline, she is bed bound. PTOT to assess.   Depression:Bipolar Disorder:Stable Continue sertraline, Depakote and risperidone   Morbid obesity with BMI greater than 50.  Possible underlying obesity hypoventilation syndrome and obstructive sleep  apnea.  Subjective:   Physical Exam: Vitals:   04/02/22 0300 04/02/22 0500 04/02/22 0628 04/02/22 0813  BP: 92/61 (!) 100/57 (!) 85/59   Pulse: (!) 112 (!) 110 (!) 110 (!) 113  Resp: 17 (!) 24 13 (!) 22  Temp:  97.7 F (36.5 C)    TempSrc:  Oral    SpO2: 92% 94% 94% 96%  Weight:      Height:       Constitutional:      Appearance: She is morbidly obese.     Comments: Acutely ill-appearing.  Dry mucous membranes,unable to assess oral mucosa at this time due to mask HENT:     Head: Normocephalic and atraumatic.     Mouth/Throat:     Eyes:     Difficult to assess Cardiovascular:     Rate and Rhythm: Normal rate and regular rhythm.  Pulmonary:     Effort: Poor Pulmonary effort.Patient has bipap mask in place    Breath sounds: Diminshed breath sounds.  Abdominal:     General: Bowel sounds are normal.     Palpations: Abdomen is soft.     Comments: Central obesity  Musculoskeletal:     Cervical back: Normal range of motion and neck supple.  Bilateral lower extremity: Edema present.     Comments: Decreased range of motion left leg.  Contracted  Skin:    General: dry skin in lower extremities Neurological:     Mental Status: She is alert.     Comments: Left-sided hemiparesis from prior stroke  Psychiatric:        Mood and Affect: Labile. Limited eval due to her condition      Behavior: Behavior normal.     Data Reviewed: Sodium 143, potassium 4.1, chloride 92, bicarb 42, glucose 126, BUN 17, creatinine 0.32.  Troponin 21 WBC 18.4,  hemoglobin 11, hematocrit 37.6, platelet count is 146 .  Influenza A+.  COVID-negative.   QTc 354/416  Microbiology: No growth at 12 hours.  Urinalysis pending  Family Communication:   Disposition: Status is: Inpatient Remains inpatient appropriate because: Respiratory failure  Planned Discharge Destination: Skilled nursing facility  Time spent: 45 minutes  Author: Artist Beach, MD 04/02/2022 10:19 AM  For on call review  [email or web address].

## 2022-04-02 NOTE — Consult Note (Signed)
NAME:  Sharon Black, MRN:  [id number], DOB:  [birth year], LOS: 1 ADMISSION DATE:  04/01/2022, CONSULTATION DATE: 04/02/22 REFERRING MD: Dr. Margette Fast, CHIEF COMPLAINT: Respiratory failure   History of Present Illness:  This is a 51 yo female who presented to [facility] ER on 12/26 via EMS from [facility] with c/o shortness of breath and cough onset 12/22.  Per ER notes EMS reported when they arrived at [facility] pt was hypoxic on 15L NRB with O2 sats at 87%.  Pt refused CPAP and IV placement.   ED Course Upon arrival to the ER pt tachypneic and dyspneic, therefore placed on Bipap.   Pt influenza A positive and CXR concerning for right basilar atelectasis with small effusion.  She received iv steroids, duonebs, tamiflu, and empiric antibiotics.  Pt admitted to the stepdown unit per hospitalist team for additional workup and treatment.  However, she remained in the ER pending bed availability.  See detailed hospital course below.  Significant labs: Chloride 85/CO2 43/glucose 139/creatinine 0.38/calcium 8.3/albumin 2.4/BNP 129.3/troponin 21/pct 0.16/wbc 18.4/hgb 11.0/platelet count 146  Pertinent  Medical History  Morbid Obesity  Chronic Hypoxic Respiratory Failure requiring Chronic O2 @4L   CVA with Residual Left-Sided Weakness  Asthma  CHF  Pulmonary Emboli  Pulmonary HTN   Significant Hospital Events: Including procedures, antibiotic start and stop dates in addition to other pertinent events   12/26: Pt admitted with acute on chronic hypoxic hypercapnic respiratory failure secondary to influenza A requiring Bipap  12/27: Pt developed worsening acute hypercapnic hypoxic respiratory failure despite continuous Bipap.  PCCM team consulted to assist with management  12/27: CTA Chest: no evidence of acute pulmonary embolism. Layering secretions        in the trachea extending into the bronchus intermedius with complete right middle        and lower lobe collapse. Subsegmental  atelectasis of the left lower lobe. Diffuse        bilateral ground-glass opacities in the right-greater-than-left upper lobes, which        may be infectious/inflammatory. Enlarged main pulmonary artery measuring up to        3.6 cm, suggestive of pulmonary arterial hypertension. Trace right pleural effusion.         Left supraclavicular lymph node measures 10 mm, likely reactive. Splenomegaly,        similar to 07/31/2021.  Interim History / Subjective:  Pt currently on Bipap settings: 18/18 FiO2 @100 %.  She is lethargic, however able to follow commands on the right.  She has chronic left sided hemiparesis secondary to previous CVA  Objective   Blood pressure (!) 92/57, pulse (!) 110, temperature 97.7 F (36.5 C), temperature source Oral, resp. rate 20, height 5\' 3"  (1.6 m), weight (!) 140 kg, SpO2 96 %.    FiO2 (%):  [80 %-100 %] 100 %  No intake or output data in the 24 hours ending 04/02/22 1328 Filed Weights   04/01/22 1659  Weight: (!) 140 kg    Examination: General: Acute on chronically-ill appearing female, NAD on Bipap  HENT: Supple, unable to assess for JVD due to body habitus  Lungs: Diminished throughout, even, non labored  Cardiovascular: Sinus tachycardia, s1s2, no r/g, 2+ radial/1+ distal pulses, no edema  Abdomen: +BS x4, soft, non tender, non distended  Extremities: Left sided hemiparesis, bilateral foot drop Neuro: Lethargic, follows commands on the right, PERRLA  GU: Purewick present   Resolved Hospital Problem list     Assessment & Plan:  Acute on chronic hypoxic hypercapnic respiratory failure secondary to influenza A likely complicated by atelectasis and right middle/lower lobe collapse  Hx: Asthma and chronic home O2 @4L   - Supplemental O2 for dyspnea and/or hypoxia - Prn and Bipap qhs  - Scheduled and prn bronchodilator  - IV solumedrol wean as tolerated  - Trend WBC and monitor fever curve  - Resume empiric ceftriaxone and azithromycin  -  Intermittent ABG and CXR  - Aggressive pulmonary hygiene   Chronic diastolic CHF  Hx: Pulmonary emboli, CVA with left-sided hemiparesis, and pulmonary hypertension - Continuous telemetry monitoring - Continue aspirin and atorvastatin once able to tolerate po's  - Continue subq lovenox    Bipolar disorder and Depression  - Continue depakote, risperdal, and sertraline  Best Practice (right click and "Reselect all SmartList Selections" daily)   Diet/type: NPO DVT prophylaxis: LMWH GI prophylaxis: PPI Lines: N/A Foley:  N/A Code Status:  DNR Last date of multidisciplinary goals of care discussion [N/A]  12/27: Updated pts father regarding pts condition and current plan of care.  He confirmed pt is DO NOT RESUSCITATE.  All questions were answered.  Labs   CBC: Recent Labs  Lab 04/01/22 1710  WBC 18.4*  NEUTROABS 16.6*  HGB 11.0*  HCT 37.6  MCV 89.1  PLT 146*    Basic Metabolic Panel: Recent Labs  Lab 04/01/22 1710 04/02/22 0358  NA 139 143  K 4.6 4.1  CL 85* 92*  CO2 43* 42*  GLUCOSE 139* 126*  BUN 15 17  CREATININE 0.38* 0.32*  CALCIUM 8.3* 8.0*   GFR: Estimated Creatinine Clearance: 117.4 mL/min (A) (by C-G formula based on SCr of 0.32 mg/dL (L)). Recent Labs  Lab 04/01/22 1710 04/01/22 1711  PROCALCITON 0.16  --   WBC 18.4*  --   LATICACIDVEN 0.7 1.3    Liver Function Tests: Recent Labs  Lab 04/01/22 1710 04/02/22 0358  AST 21 13*  ALT 7 6  ALKPHOS 44 46  BILITOT 0.9 0.3  PROT 7.0 6.5  ALBUMIN 2.4* 2.2*   No results for input(s): "LIPASE", "AMYLASE" in the last 168 hours. No results for input(s): "AMMONIA" in the last 168 hours.  ABG    Component Value Date/Time   PHART 7.32 (L) 04/02/2022 1200   PCO2ART 95 (HH) 04/02/2022 1200   PO2ART 101 04/02/2022 1200   HCO3 48.9 (H) 04/02/2022 1200   O2SAT 99.8 04/02/2022 1200     Coagulation Profile: No results for input(s): "INR", "PROTIME" in the last 168 hours.  Cardiac Enzymes: No  results for input(s): "CKTOTAL", "CKMB", "CKMBINDEX", "TROPONINI" in the last 168 hours.  HbA1C: Hgb A1c MFr Bld  Date/Time Value Ref Range Status  07/31/2021 12:26 PM 4.8 4.8 - 5.6 % Final    Comment:    (NOTE) Pre diabetes:          5.7%-6.4%  Diabetes:              >6.4%  Glycemic control for   <7.0% adults with diabetes     CBG: No results for input(s): "GLUCAP" in the last 168 hours.  Review of Systems:   Unable to assess pt on Bipap  Past Medical History:  She,  has a past medical history of Asthma, CHF (congestive heart failure) (HCC), Morbid obesity (HCC), Pulmonary emboli (HCC), and Pulmonary hypertension (HCC).   Surgical History:  History reviewed. No pertinent surgical history.   Social History:      Family History:  Her family history is  not on file.   Allergies Allergies  Allergen Reactions   Sulphamethoxydiazine Diarrhea   Tetracyclines & Related Diarrhea     Home Medications  Prior to Admission medications   Medication Sig Start Date End Date Taking? Authorizing Provider  ASPIRIN LOW DOSE 81 MG chewable tablet Chew 81 mg by mouth daily. 07/11/21  Yes [provider]  atorvastatin (LIPITOR) 40 MG tablet Take 40 mg by mouth at bedtime. 07/25/21  Yes [provider]  calcium carbonate (TUMS - DOSED IN MG ELEMENTAL CALCIUM) 500 MG chewable tablet Chew 1 tablet by mouth in the morning and at bedtime. 0800 & 1600 07/11/21  Yes [provider]  diclofenac Sodium (VOLTAREN) 1 % GEL Apply 2 g topically 4 (four) times daily. 01/30/22  Yes [provider]  divalproex (DEPAKOTE SPRINKLE) 125 MG capsule Take 250 mg by mouth daily at 12 noon. 07/25/21  Yes [provider]  divalproex (DEPAKOTE) 500 MG DR tablet Take 500 mg by mouth 2 (two) times daily. 0800 & 1600 07/25/21  Yes [provider]  docusate sodium (COLACE) 100 MG capsule Take 100 mg by mouth daily. 07/11/21  Yes [provider]  Emollient (EUCERIN  SKIN CALMING) CREA Apply topically. 03/07/22  Yes [provider]  enoxaparin (LOVENOX) 60 MG/0.6ML injection Inject 60 mg into the skin in the morning and at bedtime. 07/24/21  Yes [provider]  fluticasone (FLONASE) 50 MCG/ACT nasal spray Place 1 spray into both nostrils daily. 02/19/22  Yes [provider]  furosemide (LASIX) 40 MG tablet Take 1 tablet (40 mg total) by mouth daily. Patient taking differently: Take 30 mg by mouth daily. 08/01/21  Yes Wouk, Wilfred CurtisNoah Bedford, MD  ipratropium-albuterol (DUONEB) 0.5-2.5 (3) MG/3ML SOLN Take 3 mLs by nebulization every 12 (twelve) hours as needed. 04/23/21  Yes [provider]  [facility] powder Apply 1 Application topically 2 (two) times daily. 01/20/22  Yes [provider]  pantoprazole (PROTONIX) 20 MG tablet Take 20 mg by mouth daily. 07/25/21  Yes [provider]  potassium chloride SA (KLOR-CON M) 20 MEQ tablet Take 20 mEq by mouth daily. 03/27/22  Yes [provider]  pregabalin (LYRICA) 50 MG capsule Take 50 mg by mouth 2 (two) times daily. 0800 & 1600 07/22/21  Yes [provider]  REFRESH LIQUIGEL 1 % GEL Apply 1 drop to eye in the morning and at bedtime. 1000 & 1800 07/30/21  Yes [provider]  risperiDONE (RISPERDAL) 0.5 MG tablet Take 0.5 mg by mouth 2 (two) times daily. 0800 & 1600 07/25/21  Yes [provider]  sertraline (ZOLOFT) 100 MG tablet Take 100 mg by mouth in the morning. 07/25/21  Yes [provider]  SOLU-MEDROL, PF, 40 MG injection ACT-O-VIAL Inject 40 mg into the muscle once. 04/01/22  Yes [provider]  traMADol (ULTRAM) 50 MG tablet Take 1 tablet by mouth every 8 (eight) hours as needed. 07/02/21  Yes [provider]  acetaminophen (TYLENOL) 650 MG CR tablet Take 650 mg by mouth every 8 (eight) hours as needed for pain.    [provider]  fexofenadine (ALLEGRA) 60 MG tablet Take 60 mg by mouth daily. 07/11/21    [provider]  ibuprofen (ADVIL) 200 MG tablet Take 400 mg by mouth every 6 (six) hours as needed.    [provider]  lidocaine (LIDODERM) 5 % Place 1 patch onto the skin daily. Remove & Discard patch within 12 hours or as directed by  MD    [provider]  ondansetron (ZOFRAN) 4 MG tablet Take 4 mg by mouth every 6 (six) hours as needed. 07/24/21   [provider]  polyethylene glycol (MIRALAX / GLYCOLAX) 17 g packet Take 17 g by mouth daily as needed.    [provider]     Critical care time: 75 minutes     Zada Girt, AGNP  Pulmonary/Critical Care Pager [phone] (please enter 7 digits) PCCM Consult Pager [phone] (please enter 7 digits)

## 2022-04-02 NOTE — Progress Notes (Signed)
PHARMACIST - PHYSICIAN COMMUNICATION   CONCERNING: Methylprednisolone IV    Current order: Methylprednisolone IV 60mg  q12h     DESCRIPTION: Per [facility] Protocol:   IV methylprednisolone will be converted to either a q12h or q24h frequency with the same total daily dose (TDD).  Ordered Dose: 1 to 125 mg TDD; convert to: TDD q24h.  Ordered Dose: 126 to 250 mg TDD; convert to: TDD div q12h.  Ordered Dose: >250 mg TDD; DAW.  Order has been adjusted to: Methylprednisolone IV 120mg  q24h   , PharmD Clinical Pharmacist  04/02/2022 7:59 AM

## 2022-04-02 NOTE — ED Notes (Signed)
Patient pads/diaper saturated. Clean linens, gown, draw sheet, pillow cases, chucks, and diaper placed. Pure wick placed. When patient was rolled for cleaning, skin break down noted to bilateral lateral stomach folds and buttocks. Barrier cream applied. ABD pad placed to L lateral stomach fold where open skin break down was found. Bi-pap in place and patient repositioned. No complaints voiced.

## 2022-04-02 NOTE — Progress Notes (Signed)
PT Cancellation Note  Patient Details Name: Sharon Black MRN: [id number] DOB: 07-12-1970   Cancelled Treatment:    Reason Eval/Treat Not Completed: Medical issues which prohibited therapy. Patient currently on Bipap. Will re-attempt PT eval when patient is medically appropriate.    Cian Costanzo 04/02/2022, 9:41 AM

## 2022-04-02 NOTE — ED Notes (Signed)
Pt given a sip of water at this time

## 2022-04-02 NOTE — ED Notes (Signed)
MD aware medications not given. Will put orders in for altnerative route

## 2022-04-03 DIAGNOSIS — J101 Influenza due to other identified influenza virus with other respiratory manifestations: Secondary | ICD-10-CM

## 2022-04-03 DIAGNOSIS — J9622 Acute and chronic respiratory failure with hypercapnia: Secondary | ICD-10-CM | POA: Diagnosis not present

## 2022-04-03 DIAGNOSIS — I272 Pulmonary hypertension, unspecified: Secondary | ICD-10-CM | POA: Diagnosis not present

## 2022-04-03 DIAGNOSIS — Z8673 Personal history of transient ischemic attack (TIA), and cerebral infarction without residual deficits: Secondary | ICD-10-CM

## 2022-04-03 DIAGNOSIS — F319 Bipolar disorder, unspecified: Secondary | ICD-10-CM

## 2022-04-03 LAB — URINALYSIS, COMPLETE (UACMP) WITH MICROSCOPIC
Bacteria, UA: NONE SEEN
Bilirubin Urine: NEGATIVE
Glucose, UA: NEGATIVE mg/dL
Ketones, ur: NEGATIVE mg/dL
Leukocytes,Ua: NEGATIVE
Nitrite: NEGATIVE
Protein, ur: 30 mg/dL — AB
Specific Gravity, Urine: 1.027 (ref 1.005–1.030)
pH: 6 (ref 5.0–8.0)

## 2022-04-03 LAB — CBC WITH DIFFERENTIAL/PLATELET
Abs Immature Granulocytes: 0.14 10*3/uL — ABNORMAL HIGH (ref 0.00–0.07)
Basophils Absolute: 0 10*3/uL (ref 0.0–0.1)
Basophils Relative: 0 %
Eosinophils Absolute: 0 10*3/uL (ref 0.0–0.5)
Eosinophils Relative: 0 %
HCT: 34.8 % — ABNORMAL LOW (ref 36.0–46.0)
Hemoglobin: 10.1 g/dL — ABNORMAL LOW (ref 12.0–15.0)
Immature Granulocytes: 1 %
Lymphocytes Relative: 8 %
Lymphs Abs: 0.9 10*3/uL (ref 0.7–4.0)
MCH: 26.2 pg (ref 26.0–34.0)
MCHC: 29 g/dL — ABNORMAL LOW (ref 30.0–36.0)
MCV: 90.2 fL (ref 80.0–100.0)
Monocytes Absolute: 0.7 10*3/uL (ref 0.1–1.0)
Monocytes Relative: 7 %
Neutro Abs: 9.2 10*3/uL — ABNORMAL HIGH (ref 1.7–7.7)
Neutrophils Relative %: 84 %
Platelets: 167 10*3/uL (ref 150–400)
RBC: 3.86 MIL/uL — ABNORMAL LOW (ref 3.87–5.11)
RDW: 15.3 % (ref 11.5–15.5)
WBC: 10.9 10*3/uL — ABNORMAL HIGH (ref 4.0–10.5)
nRBC: 0 % (ref 0.0–0.2)

## 2022-04-03 LAB — BLOOD GAS, ARTERIAL
Acid-Base Excess: 18.6 mmol/L — ABNORMAL HIGH (ref 0.0–2.0)
Bicarbonate: 47.5 mmol/L — ABNORMAL HIGH (ref 20.0–28.0)
Delivery systems: POSITIVE
Expiratory PAP: 8 cmH2O
FIO2: 65 %
Inspiratory PAP: 16 cmH2O
O2 Saturation: 93.4 %
Patient temperature: 37
RATE: 10 resp/min
pCO2 arterial: 75 mmHg (ref 32–48)
pH, Arterial: 7.41 (ref 7.35–7.45)
pO2, Arterial: 66 mmHg — ABNORMAL LOW (ref 83–108)

## 2022-04-03 LAB — BASIC METABOLIC PANEL
Anion gap: 14 (ref 5–15)
BUN: 25 mg/dL — ABNORMAL HIGH (ref 6–20)
CO2: 38 mmol/L — ABNORMAL HIGH (ref 22–32)
Calcium: 8.6 mg/dL — ABNORMAL LOW (ref 8.9–10.3)
Chloride: 93 mmol/L — ABNORMAL LOW (ref 98–111)
Creatinine, Ser: 0.31 mg/dL — ABNORMAL LOW (ref 0.44–1.00)
GFR, Estimated: >60 mL/min (ref >60)
Glucose, Bld: 101 mg/dL — ABNORMAL HIGH (ref 70–99)
Potassium: 3.9 mmol/L (ref 3.5–5.1)
Sodium: 145 mmol/L (ref 135–145)

## 2022-04-03 LAB — PHOSPHORUS: Phosphorus: 3.4 mg/dL (ref 2.5–4.6)

## 2022-04-03 LAB — MAGNESIUM: Magnesium: 2.1 mg/dL (ref 1.7–2.4)

## 2022-04-03 MED ORDER — PANTOPRAZOLE SODIUM 40 MG PO TBEC: 40.0000 mg | DELAYED_RELEASE_TABLET | Freq: Every day | ORAL | Status: DC

## 2022-04-03 MED ORDER — PANTOPRAZOLE SODIUM 40 MG IV SOLR
40.0000 mg | Freq: Every day | INTRAVENOUS | Status: DC
  Administered 2022-04-03 – 2022-04-05 (×3): 40 mg via INTRAVENOUS
  Filled 2022-04-03 (×3): qty 10

## 2022-04-03 NOTE — ED Notes (Signed)
This RN to bedside to medicate pt. Medications/care clustered to avoid pt. Being off bipap for long. Pt. Taken off bipap, and placed on 10 L HFNC to medicate. Pt. Swallowed meds and water without complication. While taking meds, pt's O2 sat dropped to mid 80s%. Pt. Placed back on bipap quickly, O2 sat recovered. Dr. Blake Divine notified.

## 2022-04-03 NOTE — Progress Notes (Addendum)
[facility]                                                                               Sharon Black, is a 51 y.o. female, DOB - 08/11/1970, HWE:[id number] Admit date - 04/01/2022    Outpatient Primary MD for the patient is [name], [name], DO  LOS - 2  days    Brief summary  51 year old morbidly obese female with history of chronic respiratory failure on 2 L of nasal cannula oxygen at baseline,  History of CVA with residual weakness, pulmonary hypertension due to chronic respiratory failure and multiple PEs, morbid obesity, asthma, presents with shortness of breath.  She was also  tested positive for influenza A.  Chest x-ray also significant for moderate right-sided pleural effusion. She is admitted for acute on chronic respiratory failure with hypoxia and hypercapnia secondary to fluid overload and influenza. CTA on 12/27 does not show any acute PE but has secretions layering in the trachea extending into the bronchus intermedius with complete right middle and lower lobe collapse.    Assessment & Plan    Assessment and Plan:  Acute on chronic hypoxic and hypercapnic respiratory failure  secondary to influenza A in the setting of moderate right-sided pleural effusion , atelectasis and right middle and lower lobe collapse from mucous plugging and secretions. Patient currently is on BiPAP requiring 65% FiO2, unable to wean her off BiPAP earlier this morning. Symptomatic/supportive management with IV steroids, Tamiflu IV antibiotics, bronchodilators and aggressive pulmonary hygiene. NP at the facility reports that father would like to transition to comfort measures.  She would benefit from thoracentesis but will need palliative care consult for further goals of care. Palliative care consulted for further discussions of her goals of care.      History of CVA with residual weakness on the left Mostly bedbound without quality of life as per the father.     Bipolar disorder/depression Continue with Depakote Risperdal and sertraline if able to take by mouth  Pulmonary hypertension secondary to multiple PEs in the past  Body mass index is 54.67 kg/m. Morbid obesity Possibly underlying obesity hypoventilation syndrome and obstructive sleep apnea   Normocytic anemia Hemoglobin around 10, continue to monitor   Estimated body mass index is 54.67 kg/m as calculated from the following:   Height as of this encounter: 5\' 3"  (1.6 m).   Weight as of this encounter: 140 kg.  Code Status: DNR DVT Prophylaxis:  SCDs Start: 04/01/22 2051   Level of Care: Level of care: Stepdown Family Communication: NONE AT BEDSIDE.   Disposition Plan:     Remains inpatient appropriate:   Procedures:  None.   Consultants:   Palliative care  Antimicrobials:   Anti-infectives (From admission, onward)    Start     Dose/Rate Route Frequency Ordered Stop   04/02/22 2100  cefTRIAXone (ROCEPHIN) 2 g in sodium chloride 0.9 % 100 mL IVPB        2 g 200 mL/hr over 30 Minutes Intravenous Every 24 hours 04/02/22 1501     04/02/22 2000  azithromycin (ZITHROMAX) 500 mg in sodium chloride 0.9 % 250 mL IVPB  500 mg 250 mL/hr over 60 Minutes Intravenous Every 24 hours 04/02/22 1500 04/06/22 1959   04/02/22 1515  cefTRIAXone (ROCEPHIN) 2 g in sodium chloride 0.9 % 100 mL IVPB  Status:  Discontinued        2 g 200 mL/hr over 30 Minutes Intravenous Every 24 hours 04/02/22 1501 04/02/22 1501   04/01/22 2030  oseltamivir (TAMIFLU) capsule 75 mg        75 mg Oral 2 times daily 04/01/22 1935     04/01/22 1930  cefTRIAXone (ROCEPHIN) 2 g in sodium chloride 0.9 % 100 mL IVPB        2 g 200 mL/hr over 30 Minutes Intravenous  Once 04/01/22 1928 04/01/22 2023   04/01/22 1930  azithromycin (ZITHROMAX) 500 mg in sodium chloride 0.9 % 250 mL IVPB        500 mg 250 mL/hr over 60 Minutes Intravenous  Once 04/01/22 1928 04/01/22 2130         Medications  Scheduled Meds:  aspirin  81 mg Oral Daily   atorvastatin  40 mg Oral QHS   divalproex  250 mg Oral Daily   divalproex  500 mg Oral BID   docusate sodium  100 mg Oral Daily   enoxaparin  60 mg Subcutaneous Q12H   guaiFENesin  600 mg Oral BID   ipratropium-albuterol  3 mL Nebulization Q6H   loratadine  10 mg Oral Daily   methylPREDNISolone (SOLU-MEDROL) injection  80 mg Intravenous Q24H   oseltamivir  75 mg Oral BID   pantoprazole (PROTONIX) IV  40 mg Intravenous Q24H   pregabalin  50 mg Oral BID   risperiDONE  0.5 mg Oral BID   sertraline  100 mg Oral q AM   Continuous Infusions:  azithromycin (ZITHROMAX) 500 mg in sodium chloride 0.9 % 250 mL IVPB Stopped (04/02/22 2031)   cefTRIAXone (ROCEPHIN)  IV Stopped (04/03/22 0124)   PRN Meds:.acetaminophen, albuterol, ibuprofen, traMADol    Subjective:   Sharon Black was seen and examined today.  Currently not following commands and on BiPAP  Objective:   Vitals:   04/03/22 0715 04/03/22 0730 04/03/22 0745 04/03/22 0800  BP:    (!) 116/91  Pulse: (!) 112 (!) 113 (!) 110 (!) 110  Resp: (!) 23 18 (!) 21 (!) 23  Temp:    98.8 F (37.1 C)  TempSrc:    Axillary  SpO2: 93% 95% 94% 95%  Weight:      Height:       No intake or output data in the 24 hours ending 04/03/22 1107 Filed Weights   04/01/22 1659  Weight: (!) 140 kg     Exam. General exam: Morbidly obese lady ill-appearing and on BiPAP Respiratory system: Diminished air entry at bases Cardiovascular system: S1 & S2 heard, RRR.  Gastrointestinal system: Abdomen is soft and nondistended Central nervous system: Following commands Sleepy on BiPAP Extremities: Pedal edema present Skin: No rashes, seen Psychiatry: Unable to assess    Data Reviewed:  I have personally reviewed following labs and imaging studies   CBC Lab Results  Component Value Date   WBC 10.9 (H) 04/03/2022   RBC 3.86 (L) 04/03/2022   HGB 10.1 (L) 04/03/2022   HCT  34.8 (L) 04/03/2022   MCV 90.2 04/03/2022   MCH 26.2 04/03/2022   PLT 167 04/03/2022   MCHC 29.0 (L) 04/03/2022   RDW 15.3 04/03/2022   LYMPHSABS 0.9 04/03/2022   MONOABS 0.7 04/03/2022   EOSABS 0.0 04/03/2022  BASOSABS 0.0 [id number]     Last metabolic panel Lab Results  Component Value Date   NA 145 04/03/2022   K 3.9 04/03/2022   CL 93 (L) 04/03/2022   CO2 38 (H) 04/03/2022   BUN 25 (H) 04/03/2022   CREATININE 0.31 (L) 04/03/2022   GLUCOSE 101 (H) 04/03/2022   GFRNONAA >60 04/03/2022   CALCIUM 8.6 (L) 04/03/2022   PHOS 3.4 04/03/2022   PROT 6.5 04/02/2022   ALBUMIN 2.2 (L) 04/02/2022   BILITOT 0.3 04/02/2022   ALKPHOS 46 04/02/2022   AST 13 (L) 04/02/2022   ALT 6 04/02/2022   ANIONGAP 14 04/03/2022    CBG (last 3)  Recent Labs    04/02/22 1336  GLUCAP 115*      Coagulation Profile: No results for input(s): "INR", "PROTIME" in the last 168 hours.   Radiology Studies: CT Angio Chest Pulmonary Embolism (PE) W or WO Contrast  Result Date: 04/02/2022 CLINICAL DATA:  Shortness of breath found to have large right pleural effusion EXAM: CT ANGIOGRAPHY CHEST WITH CONTRAST TECHNIQUE: Multidetector CT imaging of the chest was performed using the standard protocol during bolus administration of intravenous contrast. Multiplanar CT image reconstructions and MIPs were obtained to evaluate the vascular anatomy. RADIATION DOSE REDUCTION: This exam was performed according to the departmental dose-optimization program which includes automated exposure control, adjustment of the mA and/or kV according to patient size and/or use of iterative reconstruction technique. CONTRAST:  40mL OMNIPAQUE IOHEXOL 350 MG/ML SOLN COMPARISON:  Chest radiograph dated 04/01/2022, CT chest dated 07/31/2021 FINDINGS: Cardiovascular: The study is adequate quality for the evaluation of pulmonary embolism to the proximal subsegmental levels. There are no filling defects in the central, lobar, segmental  or proximal subsegmental pulmonary artery branches to suggest acute pulmonary embolism. Main pulmonary artery measures 3.6 cm. Normal heart size. No significant pericardial fluid/thickening. Mediastinum/Nodes: Partially imaged thyroid gland without nodules meeting criteria for imaging follow-up by size. Normal esophagus. Left supraclavicular lymph node measures 10 mm (4:6). Lungs/Pleura: The central airways are patent. Layering secretions within the trachea extending into the bronchus intermedius. Complete right middle and lower lobe collapse. Subsegmental atelectasis of the left lower lobe. Diffuse bilateral ground-glass opacities in the right-greater-than-left upper lobes. No pneumothorax. Trace right pleural effusion. Upper abdomen: Enlarged spleen in the AP dimension measures 15.9 cm, similar to 07/31/2021. Musculoskeletal: Median sternotomy wires are nondisplaced. No acute or abnormal lytic or blastic osseous lesions. Review of the MIP images confirms the above findings. IMPRESSION: 1. No evidence of acute pulmonary embolism. 2. Layering secretions in the trachea extending into the bronchus intermedius with complete right middle and lower lobe collapse. Subsegmental atelectasis of the left lower lobe. 3. Diffuse bilateral ground-glass opacities in the right-greater-than-left upper lobes, which may be infectious/inflammatory. 4. Enlarged main pulmonary artery measuring up to 3.6 cm, suggestive of pulmonary arterial hypertension. 5. Trace right pleural effusion. 6. Left supraclavicular lymph node measures 10 mm, likely reactive. 7. Splenomegaly, similar to 07/31/2021. Electronically Signed   By: Darrin Nipper M.D.   On: 04/02/2022 09:18   DG Chest Portable 1 View  Result Date: 04/01/2022 CLINICAL DATA:  Dyspnea EXAM: PORTABLE CHEST 1 VIEW COMPARISON:  07/31/2021 FINDINGS: Lung volumes are small. Moderate right pleural effusion is present with associated right basilar atelectasis. Superimposed perihilar  interstitial pulmonary infiltrate is present suggesting superimposed mild interstitial pulmonary edema, possibly cardiogenic in nature. No pneumothorax. No pleural effusion on the left. Cardiac size is mildly enlarged. Median sternotomy has been performed. No acute bone  abnormality. IMPRESSION: 1. Moderate right pleural effusion with associated right basilar atelectasis. 2. Stable cardiomegaly.  Mild cardiogenic failure. 3. Pulmonary hypoinflation. Electronically Signed   By: Fidela Salisbury M.D.   On: 04/01/2022 17:53     Time spent: 52 minutes.   Hosie Poisson M.D. [facility] 04/03/2022, 11:07 AM  Available via Epic secure chat 7am-7pm After 7 pm, please refer to night coverage provider listed on amion.

## 2022-04-03 NOTE — ED Notes (Signed)
Pt sleeping at this time, respirations even and unlabored.

## 2022-04-03 NOTE — ED Notes (Addendum)
This RN and ariel RN to bedside to clean pt. Linens, chucks, brief and purewick changed. Moisture barrier applied to small skin breakdown in folds. Pillows applied to cushion pt's abdomen from siderails. Pillow repositioned under pt's left leg, and left arm for comfort. Bipap maintained. Purewick functioning. Pt. Given sips of apple juice per request. Warm blankets provided per request. Pt. Denies any further need. NAD.

## 2022-04-03 NOTE — Progress Notes (Signed)
OT Cancellation Note  Patient Details Name: Sharon Black MRN: [id number] DOB: Mar 26, 1971   Cancelled Treatment:    Reason Eval/Treat Not Completed: OT screened, no needs identified, will sign off. Chart reviewed, per OT previous OT evaluations pt is bed bound baseline living at [facility]. No skilled acute OT needs identified, will sign off.   Kathie Dike, M.S. OTR/L  04/03/22, 11:10 AM  ascom [phone]

## 2022-04-03 NOTE — Progress Notes (Signed)
PT Cancellation Note  Patient Details Name: Sharon Black MRN: [id number] DOB: 03-Mar-1971   Cancelled Treatment:    Reason Eval/Treat Not Completed: PT screened, no needs identified, will sign off. Patient has been bed bound prior to admission. No needs at this time.    Rashaad Hallstrom 04/03/2022, 11:07 AM

## 2022-04-03 NOTE — ED Notes (Addendum)
Dr. Blake Divine at bedside. States do not remove bipap for meds anymore, will order necessary meds IV.

## 2022-04-03 NOTE — Progress Notes (Signed)
NAME:  Sharon Black, MRN:  [id number], DOB:  [birth year], LOS: 2 ADMISSION DATE:  04/01/2022, CONSULTATION DATE: 04/02/22 REFERRING MD: Dr. Margette Fast, CHIEF COMPLAINT: Respiratory failure   History of Present Illness:  This is a 51 yo female who presented to [facility] ER on 12/26 via EMS from [facility] with c/o shortness of breath and cough onset 12/22.  Per ER notes EMS reported when they arrived at [facility] pt was hypoxic on 15L NRB with O2 sats at 87%.  Pt refused CPAP and IV placement.   ED Course Upon arrival to the ER pt tachypneic and dyspneic, therefore placed on Bipap.   Pt influenza A positive and CXR concerning for right basilar atelectasis with small effusion.  She received iv steroids, duonebs, tamiflu, and empiric antibiotics.  Pt admitted to the stepdown unit per hospitalist team for additional workup and treatment.  However, she remained in the ER pending bed availability.  See detailed hospital course below.  04/03/22- pccm will sign off due to family requesting de-escalation of care and comfort measures.  [facility] hospitalist has assumed care of patient, please call us if further assistance is necessary. Patient remains critically ill.   Pertinent  Medical History  Morbid Obesity  Chronic Hypoxic Respiratory Failure requiring Chronic O2 @4L   CVA with Residual Left-Sided Weakness  Asthma  CHF  Pulmonary Emboli  Pulmonary HTN   Significant Hospital Events: Including procedures, antibiotic start and stop dates in addition to other pertinent events   12/26: Pt admitted with acute on chronic hypoxic hypercapnic respiratory failure secondary to influenza A requiring Bipap  12/27: Pt developed worsening acute hypercapnic hypoxic respiratory failure despite continuous Bipap.  PCCM team consulted to assist with management  12/27: CTA Chest: no evidence of acute pulmonary embolism. Layering secretions        in the trachea extending into the bronchus intermedius with  complete right middle        and lower lobe collapse. Subsegmental atelectasis of the left lower lobe. Diffuse        bilateral ground-glass opacities in the right-greater-than-left upper lobes, which        may be infectious/inflammatory. Enlarged main pulmonary artery measuring up to        3.6 cm, suggestive of pulmonary arterial hypertension. Trace right pleural effusion.         Left supraclavicular lymph node measures 10 mm, likely reactive. Splenomegaly,        similar to 07/31/2021.  Interim History / Subjective:  Pt currently on Bipap settings: 18/18 FiO2 @100 %.  She is lethargic, however able to follow commands on the right.  She has chronic left sided hemiparesis secondary to previous CVA  Objective   Blood pressure 99/74, pulse (!) 105, temperature 98.8 F (37.1 C), temperature source Axillary, resp. rate 20, height 5\' 3"  (1.6 m), weight (!) 140 kg, SpO2 93 %.    FiO2 (%):  [50 %-70 %] 50 %  No intake or output data in the 24 hours ending 04/03/22 1633 Filed Weights   04/01/22 1659  Weight: (!) 140 kg    Examination: General: Acute on chronically-ill appearing female, NAD on Bipap  HENT: Supple, unable to assess for JVD due to body habitus  Lungs: Diminished throughout, even, non labored  Cardiovascular: Sinus tachycardia, s1s2, no r/g, 2+ radial/1+ distal pulses, no edema  Abdomen: +BS x4, soft, non tender, non distended  Extremities: Left sided hemiparesis, bilateral foot drop Neuro: Lethargic, follows commands on the right,  PERRLA  GU: Purewick present   Resolved Hospital Problem list     Assessment & Plan:  Acute on chronic hypoxic hypercapnic respiratory failure secondary to influenza A likely complicated by atelectasis and right middle/lower lobe collapse  Hx: Asthma and chronic home O2 @4L   - Supplemental O2 for dyspnea and/or hypoxia - Prn and Bipap qhs  - Scheduled and prn bronchodilator  - IV solumedrol wean as tolerated  - Trend WBC and monitor fever  curve  - Resume empiric ceftriaxone and azithromycin  - Intermittent ABG and CXR  - Aggressive pulmonary hygiene   Chronic diastolic CHF  Hx: Pulmonary emboli, CVA with left-sided hemiparesis, and pulmonary hypertension - Continuous telemetry monitoring - Continue aspirin and atorvastatin once able to tolerate po's  - Continue subq lovenox    Bipolar disorder and Depression  - Continue depakote, risperdal, and sertraline  Best Practice (right click and "Reselect all SmartList Selections" daily)   Diet/type: NPO DVT prophylaxis: LMWH GI prophylaxis: PPI Lines: N/A Foley:  N/A Code Status:  DNR Last date of multidisciplinary goals of care discussion [N/A]  12/27: Updated pts father regarding pts condition and current plan of care.  He confirmed pt is DO NOT RESUSCITATE.  All questions were answered.  Labs   CBC: Recent Labs  Lab 04/01/22 1710 04/02/22 1739 04/03/22 0631  WBC 18.4* 11.8* 10.9*  NEUTROABS 16.6*  --  9.2*  HGB 11.0* 10.1* 10.1*  HCT 37.6 34.5* 34.8*  MCV 89.1 89.1 90.2  PLT 146* 156 167     Basic Metabolic Panel: Recent Labs  Lab 04/01/22 1710 04/02/22 0358 04/03/22 0631  NA 139 143 145  K 4.6 4.1 3.9  CL 85* 92* 93*  CO2 43* 42* 38*  GLUCOSE 139* 126* 101*  BUN 15 17 25*  CREATININE 0.38* 0.32* 0.31*  CALCIUM 8.3* 8.0* 8.6*  MG  --   --  2.1  PHOS  --   --  3.4    GFR: Estimated Creatinine Clearance: 117.4 mL/min (A) (by C-G formula based on SCr of 0.31 mg/dL (L)). Recent Labs  Lab 04/01/22 1710 04/01/22 1711 04/02/22 1739 04/03/22 0631  PROCALCITON 0.16  --  0.12  --   WBC 18.4*  --  11.8* 10.9*  LATICACIDVEN 0.7 1.3  --   --      Liver Function Tests: Recent Labs  Lab 04/01/22 1710 04/02/22 0358  AST 21 13*  ALT 7 6  ALKPHOS 44 46  BILITOT 0.9 0.3  PROT 7.0 6.5  ALBUMIN 2.4* 2.2*    No results for input(s): "LIPASE", "AMYLASE" in the last 168 hours. No results for input(s): "AMMONIA" in the last 168 hours.  ABG     Component Value Date/Time   PHART 7.41 04/03/2022 0423   PCO2ART 75 (HH) 04/03/2022 0423   PO2ART 66 (L) 04/03/2022 0423   HCO3 47.5 (H) 04/03/2022 0423   O2SAT 93.4 04/03/2022 0423     Coagulation Profile: No results for input(s): "INR", "PROTIME" in the last 168 hours.  Cardiac Enzymes: No results for input(s): "CKTOTAL", "CKMB", "CKMBINDEX", "TROPONINI" in the last 168 hours.  HbA1C: Hgb A1c MFr Bld  Date/Time Value Ref Range Status  07/31/2021 12:26 PM 4.8 4.8 - 5.6 % Final    Comment:    (NOTE) Pre diabetes:          5.7%-6.4%  Diabetes:              >6.4%  Glycemic control for   <7.0% adults  with diabetes     CBG: Recent Labs  Lab 04/02/22 1336  GLUCAP 115*    Review of Systems:   Unable to assess pt on Bipap  Past Medical History:  She,  has a past medical history of Asthma, CHF (congestive heart failure) (HCC), Morbid obesity (HCC), Pulmonary emboli (HCC), and Pulmonary hypertension (HCC).   Surgical History:  History reviewed. No pertinent surgical history.   Social History:      Family History:  Her family history is not on file.   Allergies Allergies  Allergen Reactions   Sulphamethoxydiazine Diarrhea   Tetracyclines & Related Diarrhea     Home Medications  Prior to Admission medications   Medication Sig Start Date End Date Taking? Authorizing Provider  ASPIRIN LOW DOSE 81 MG chewable tablet Chew 81 mg by mouth daily. 07/11/21  Yes [provider]  atorvastatin (LIPITOR) 40 MG tablet Take 40 mg by mouth at bedtime. 07/25/21  Yes [provider]  calcium carbonate (TUMS - DOSED IN MG ELEMENTAL CALCIUM) 500 MG chewable tablet Chew 1 tablet by mouth in the morning and at bedtime. 0800 & 1600 07/11/21  Yes [provider]  diclofenac Sodium (VOLTAREN) 1 % GEL Apply 2 g topically 4 (four) times daily. 01/30/22  Yes [provider]  divalproex (DEPAKOTE SPRINKLE) 125 MG capsule Take 250 mg by mouth daily at 12  noon. 07/25/21  Yes [provider]  divalproex (DEPAKOTE) 500 MG DR tablet Take 500 mg by mouth 2 (two) times daily. 0800 & 1600 07/25/21  Yes [provider]  docusate sodium (COLACE) 100 MG capsule Take 100 mg by mouth daily. 07/11/21  Yes [provider]  Emollient (EUCERIN SKIN CALMING) CREA Apply topically. 03/07/22  Yes [provider]  enoxaparin (LOVENOX) 60 MG/0.6ML injection Inject 60 mg into the skin in the morning and at bedtime. 07/24/21  Yes [provider]  fluticasone (FLONASE) 50 MCG/ACT nasal spray Place 1 spray into both nostrils daily. 02/19/22  Yes [provider]  furosemide (LASIX) 40 MG tablet Take 1 tablet (40 mg total) by mouth daily. Patient taking differently: Take 30 mg by mouth daily. 08/01/21  Yes Wouk, Wilfred Curtis, MD  ipratropium-albuterol (DUONEB) 0.5-2.5 (3) MG/3ML SOLN Take 3 mLs by nebulization every 12 (twelve) hours as needed. 04/23/21  Yes [provider]  [facility] powder Apply 1 Application topically 2 (two) times daily. 01/20/22  Yes [provider]  pantoprazole (PROTONIX) 20 MG tablet Take 20 mg by mouth daily. 07/25/21  Yes [provider]  potassium chloride SA (KLOR-CON M) 20 MEQ tablet Take 20 mEq by mouth daily. 03/27/22  Yes [provider]  pregabalin (LYRICA) 50 MG capsule Take 50 mg by mouth 2 (two) times daily. 0800 & 1600 07/22/21  Yes [provider]  REFRESH LIQUIGEL 1 % GEL Apply 1 drop to eye in the morning and at bedtime. 1000 & 1800 07/30/21  Yes [provider]  risperiDONE (RISPERDAL) 0.5 MG tablet Take 0.5 mg by mouth 2 (two) times daily. 0800 & 1600 07/25/21  Yes [provider]  sertraline (ZOLOFT) 100 MG tablet Take 100 mg by mouth in the morning. 07/25/21  Yes [provider]  SOLU-MEDROL, PF, 40 MG injection ACT-O-VIAL Inject 40 mg into the muscle once. 04/01/22  Yes [provider]  traMADol (ULTRAM) 50 MG  tablet Take 1 tablet by mouth every 8 (eight) hours as needed. 07/02/21  Yes [provider]  acetaminophen (TYLENOL) 650  MG CR tablet Take 650 mg by mouth every 8 (eight) hours as needed for pain.    [provider]  fexofenadine (ALLEGRA) 60 MG tablet Take 60 mg by mouth daily. 07/11/21   [provider]  ibuprofen (ADVIL) 200 MG tablet Take 400 mg by mouth every 6 (six) hours as needed.    [provider]  lidocaine (LIDODERM) 5 % Place 1 patch onto the skin daily. Remove & Discard patch within 12 hours or as directed by MD    [provider]  ondansetron (ZOFRAN) 4 MG tablet Take 4 mg by mouth every 6 (six) hours as needed. 07/24/21   [provider]  polyethylene glycol (MIRALAX / GLYCOLAX) 17 g packet Take 17 g by mouth daily as needed.    [provider]     Critical care provider statement:   Total critical care time: 33 minutes   Performed by: Karna Christmas MD   Critical care time was exclusive of separately billable procedures and treating other patients.   Critical care was necessary to treat or prevent imminent or life-threatening deterioration.   Critical care was time spent personally by me on the following activities: development of treatment plan with patient and/or surrogate as well as nursing, discussions with consultants, evaluation of patient's response to treatment, examination of patient, obtaining history from patient or surrogate, ordering and performing treatments and interventions, ordering and review of laboratory studies, ordering and review of radiographic studies, pulse oximetry and re-evaluation of patient's condition.    Vida Rigger, M.D.  Pulmonary & Critical Care Medicine

## 2022-04-04 DIAGNOSIS — I5032 Chronic diastolic (congestive) heart failure: Secondary | ICD-10-CM

## 2022-04-04 DIAGNOSIS — G9341 Metabolic encephalopathy: Secondary | ICD-10-CM

## 2022-04-04 DIAGNOSIS — J9601 Acute respiratory failure with hypoxia: Secondary | ICD-10-CM | POA: Diagnosis not present

## 2022-04-04 DIAGNOSIS — J101 Influenza due to other identified influenza virus with other respiratory manifestations: Secondary | ICD-10-CM | POA: Diagnosis not present

## 2022-04-04 DIAGNOSIS — Z66 Do not resuscitate: Secondary | ICD-10-CM

## 2022-04-04 DIAGNOSIS — J9622 Acute and chronic respiratory failure with hypercapnia: Secondary | ICD-10-CM | POA: Diagnosis not present

## 2022-04-04 DIAGNOSIS — Z515 Encounter for palliative care: Secondary | ICD-10-CM | POA: Diagnosis not present

## 2022-04-04 LAB — BLOOD GAS, ARTERIAL
Acid-Base Excess: 23.3 mmol/L — ABNORMAL HIGH (ref 0.0–2.0)
Bicarbonate: 51.1 mmol/L — ABNORMAL HIGH (ref 20.0–28.0)
Delivery systems: POSITIVE
Expiratory PAP: 8 cmH2O
Inspiratory PAP: 22 cmH2O
O2 Saturation: 93 %
Patient temperature: 37
pCO2 arterial: 67 mmHg (ref 32–48)
pH, Arterial: 7.49 — ABNORMAL HIGH (ref 7.35–7.45)
pO2, Arterial: 66 mmHg — ABNORMAL LOW (ref 83–108)

## 2022-04-04 LAB — GLUCOSE, CAPILLARY: Glucose-Capillary: 106 mg/dL — ABNORMAL HIGH (ref 70–99)

## 2022-04-04 MED ORDER — SODIUM CHLORIDE 0.9 % IV SOLN: INTRAVENOUS | Status: DC | PRN

## 2022-04-04 MED ORDER — FUROSEMIDE 10 MG/ML IJ SOLN
20.0000 mg | Freq: Once | INTRAMUSCULAR | Status: AC
  Administered 2022-04-04: 20 mg via INTRAVENOUS
  Filled 2022-04-04: qty 4

## 2022-04-04 MED ORDER — KETOROLAC TROMETHAMINE 15 MG/ML IJ SOLN
15.0000 mg | Freq: Three times a day (TID) | INTRAMUSCULAR | Status: AC | PRN
  Administered 2022-04-04 – 2022-04-07 (×5): 15 mg via INTRAVENOUS
  Filled 2022-04-04 (×5): qty 1

## 2022-04-04 MED ORDER — FLUTICASONE PROPIONATE 50 MCG/ACT NA SUSP
1.0000 | Freq: Every day | NASAL | Status: DC
  Administered 2022-04-04 – 2022-04-18 (×14): 1 via NASAL
  Filled 2022-04-04: qty 16

## 2022-04-04 MED ORDER — LOPERAMIDE HCL 2 MG PO CAPS
2.0000 mg | ORAL_CAPSULE | ORAL | Status: DC | PRN
  Administered 2022-04-04 – 2022-04-17 (×15): 2 mg via ORAL
  Filled 2022-04-04 (×15): qty 1

## 2022-04-04 MED ORDER — CHLORHEXIDINE GLUCONATE CLOTH 2 % EX PADS
6.0000 | MEDICATED_PAD | Freq: Every day | CUTANEOUS | Status: DC
  Administered 2022-04-04 – 2022-04-18 (×13): 6 via TOPICAL

## 2022-04-04 MED ORDER — LORAZEPAM 0.5 MG PO TABS: 0.5000 mg | ORAL_TABLET | Freq: Four times a day (QID) | ORAL | Status: DC | PRN

## 2022-04-04 MED ORDER — DICLOFENAC SODIUM 1 % EX GEL
2.0000 g | Freq: Four times a day (QID) | CUTANEOUS | Status: DC
  Administered 2022-04-05 – 2022-04-18 (×47): 2 g via TOPICAL
  Filled 2022-04-04 (×2): qty 100

## 2022-04-04 NOTE — Progress Notes (Signed)
[facility]                                                                               Sharon Black, is a 51 y.o. female, DOB - 01-Nov-1970, ZOX:[id number] Admit date - 04/01/2022    Outpatient Primary MD for the patient is [name], [name], DO  LOS - 3  days    Brief summary  51 year old morbidly obese female with history of chronic respiratory failure on 2 L of nasal cannula oxygen at baseline,  History of CVA with residual weakness, pulmonary hypertension due to chronic respiratory failure and multiple PEs, morbid obesity, asthma, presents with shortness of breath.  She was also  tested positive for influenza A.  Chest x-ray also significant for moderate right-sided pleural effusion. She is admitted for acute on chronic respiratory failure with hypoxia and hypercapnia secondary to fluid overload and influenza. CTA on 12/27 does not show any acute PE but has secretions layering in the trachea extending into the bronchus intermedius with complete right middle and lower lobe collapse. She was admitted for acute on chronic hypoxic and hypercapnic respiratory failure.    Assessment & Plan    Assessment and Plan:  Acute on chronic hypoxic and hypercapnic respiratory failure  secondary to influenza A in the setting of moderate right-sided pleural effusion , atelectasis and right middle and lower lobe collapse from mucous plugging and secretions. Patient currently is on BiPAP , ,possible transition to [place] oxygen to keep sats greater than 90%.  Symptomatic/supportive management with IV steroids, Tamiflu, IV antibiotics, bronchodilators and aggressive pulmonary hygiene. She would benefit from thoracentesis, IR consulted, US thoracentesis ordered.    Palliative care consulted for further discussions of her goals of care.   Acute metabolic encephalopathy:  Possibly from hypercapnic respiratory failure, on BIPAP.  Continue to monitor. Holding home dose of risperidone  and seroquel due to lethargy.    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   Bipolar disorder/depression Continue with Depakote Risperdal and sertraline if able to take by mouth  Pulmonary hypertension secondary to multiple PEs in the past  Body mass index is 54.67 kg/m. Morbid obesity Possibly underlying obesity hypoventilation syndrome and obstructive sleep apnea   Normocytic anemia Hemoglobin around 10, continue to monitor  Chronic diastolic heart failure:  Echocardiogram earlier this year showed preserved LVEF with grade 1 diastolic dysfunction. BNP slightly elevated.  Imaging showing moderate right sided effusion.  Patient is on lasix 40 mg daily at home ,. Gave her a dose of IV lasix 20 mg this morning.  Her BP's are soft, evaluate and dose the lasix in the morning. .    Estimated body mass index is 54.67 kg/m as calculated from the following:   Height as of this encounter: 5\' 3"  (1.6 m).   Weight as of this encounter: 140 kg.  Code Status: DNR DVT Prophylaxis:  SCDs Start: 04/01/22 2051   Level of Care: Level of care: Stepdown Family Communication: NONE AT BEDSIDE.   Disposition Plan:     Remains inpatient appropriate: requiring   Procedures:  None.   Consultants:   Palliative care  Antimicrobials:   Anti-infectives (From  admission, onward)    Start     Dose/Rate Route Frequency Ordered Stop   04/02/22 2100  cefTRIAXone (ROCEPHIN) 2 g in sodium chloride 0.9 % 100 mL IVPB        2 g 200 mL/hr over 30 Minutes Intravenous Every 24 hours 04/02/22 1501     04/02/22 2000  azithromycin (ZITHROMAX) 500 mg in sodium chloride 0.9 % 250 mL IVPB        500 mg 250 mL/hr over 60 Minutes Intravenous Every 24 hours 04/02/22 1500 04/06/22 1959   04/02/22 1515  cefTRIAXone (ROCEPHIN) 2 g in sodium chloride 0.9 % 100 mL IVPB  Status:  Discontinued        2 g 200 mL/hr over 30 Minutes Intravenous Every 24 hours 04/02/22 1501 04/02/22 1501    04/01/22 2030  oseltamivir (TAMIFLU) capsule 75 mg        75 mg Oral 2 times daily 04/01/22 1935     04/01/22 1930  cefTRIAXone (ROCEPHIN) 2 g in sodium chloride 0.9 % 100 mL IVPB        2 g 200 mL/hr over 30 Minutes Intravenous  Once 04/01/22 1928 04/01/22 2023   04/01/22 1930  azithromycin (ZITHROMAX) 500 mg in sodium chloride 0.9 % 250 mL IVPB        500 mg 250 mL/hr over 60 Minutes Intravenous  Once 04/01/22 1928 04/01/22 2130        Medications  Scheduled Meds:  aspirin  81 mg Oral Daily   atorvastatin  40 mg Oral QHS   divalproex  250 mg Oral Daily   divalproex  500 mg Oral BID   docusate sodium  100 mg Oral Daily   enoxaparin  60 mg Subcutaneous Q12H   guaiFENesin  600 mg Oral BID   ipratropium-albuterol  3 mL Nebulization Q6H   loratadine  10 mg Oral Daily   methylPREDNISolone (SOLU-MEDROL) injection  80 mg Intravenous Q24H   oseltamivir  75 mg Oral BID   pantoprazole (PROTONIX) IV  40 mg Intravenous Daily   pregabalin  50 mg Oral BID   Continuous Infusions:  azithromycin (ZITHROMAX) 500 mg in sodium chloride 0.9 % 250 mL IVPB Stopped (04/04/22 0053)   cefTRIAXone (ROCEPHIN)  IV Stopped (04/03/22 2117)   PRN Meds:.acetaminophen, albuterol, ibuprofen, ketorolac, loperamide    Subjective:   Brunella Wileman was seen and examined today.  Remains on BIPAP.   Objective:   Vitals:   04/04/22 1106 04/04/22 1200 04/04/22 1212 04/04/22 1500  BP:  106/68  112/84  Pulse: (!) 102 (!) 101  (!) 109  Resp: 20 (!) 24    Temp: 98.3 F (36.8 C)  99.5 F (37.5 C)   TempSrc: Oral  Oral   SpO2: 93% 98%  94%  Weight:      Height:        Intake/Output Summary (Last 24 hours) at 04/04/2022 1627 Last data filed at 04/04/2022 0857 Gross per 24 hour  Intake 494.98 ml  Output 500 ml  Net -5.02 ml   Filed Weights   04/01/22 1659  Weight: (!) 140 kg     Exam. General exam: Ill appearing lady ,back on BIPAP, earlier was on 10 lit of [place] oxygen.  Respiratory system:   diminished air entry, scattered rhonchi.  Cardiovascular system: S1 & S2 heard, RRR. Pedal edema present.  Gastrointestinal system: Abdomen is nondistended, soft and nontender.  Central nervous system: alert , oriented to person and place.  Extremities: pedal edema present.  Skin: No rashes,      Data Reviewed:  I have personally reviewed following labs and imaging studies   CBC Lab Results  Component Value Date   WBC 10.9 (H) 04/03/2022   RBC 3.86 (L) 04/03/2022   HGB 10.1 (L) 04/03/2022   HCT 34.8 (L) 04/03/2022   MCV 90.2 04/03/2022   MCH 26.2 04/03/2022   PLT 167 04/03/2022   MCHC 29.0 (L) 04/03/2022   RDW 15.3 04/03/2022   LYMPHSABS 0.9 04/03/2022   MONOABS 0.7 04/03/2022   EOSABS 0.0 04/03/2022   BASOSABS 0.0 04/03/2022     Last metabolic panel Lab Results  Component Value Date   NA 145 04/03/2022   K 3.9 04/03/2022   CL 93 (L) 04/03/2022   CO2 38 (H) 04/03/2022   BUN 25 (H) 04/03/2022   CREATININE 0.31 (L) 04/03/2022   GLUCOSE 101 (H) 04/03/2022   GFRNONAA >60 04/03/2022   CALCIUM 8.6 (L) 04/03/2022   PHOS 3.4 04/03/2022   PROT 6.5 04/02/2022   ALBUMIN 2.2 (L) 04/02/2022   BILITOT 0.3 04/02/2022   ALKPHOS 46 04/02/2022   AST 13 (L) 04/02/2022   ALT 6 04/02/2022   ANIONGAP 14 04/03/2022    CBG (last 3)  Recent Labs    04/02/22 1336  GLUCAP 115*       Coagulation Profile: No results for input(s): "INR", "PROTIME" in the last 168 hours.   Radiology Studies: No results found.   Time spent: 45 minutes  Kathlen Mody M.D. [facility] 04/04/2022, 4:27 PM  Available via Epic secure chat 7am-7pm After 7 pm, please refer to night coverage provider listed on amion.

## 2022-04-04 NOTE — ED Notes (Signed)
The pt was placed back on bipap.

## 2022-04-04 NOTE — Consult Note (Signed)
Consultation Note Date: 04/04/2022 at 0945  Patient Name: Sharon Black  DOB: [birth year]  MRN: [id number]  Age / Sex: 51 y.o., female  PCP: Sherol Dade, DO Referring Physician: Kathlen Mody, MD  Reason for Consultation: Establishing goals of care  HPI/Patient Profile: 51 y.o. female  with past medical history of chronic respiratory failure (2 L nasal cannula at baseline), history of CVA with residual left-sided weakness, pulmonary hypertension, multiple PEs, morbid obesity, asthma, and anxietyadmitted on 04/01/2022 with SOB.  Patient tested positive for influenza A.  CTA on 12/27 does not reveal any acute PEs but patient has secretions layering in the trachea extending into the bronchus intermedius with complete right middle and lower lobe collapse.  Patient is being treated with IV steroids, Tamiflu, IV antibiotics, bronchodilators, and aggressive pulmonary hygiene.  PMT was consulted to discuss goals of care.   Clinical Assessment and Goals of Care: I have reviewed medical records including EPIC notes, labs and imaging, assessed the patient attempted to discuss diagnosis prognosis, GOC, EOL wishes, disposition and options.  Patient is alert and oriented but minimally interactive.  I introduced Palliative Medicine as specialized medical care for people living with serious illness. It focuses on providing relief from the symptoms and stress of a serious illness. The goal is to improve quality of life for both the patient and the family.  Pain assessment completed.  Patient shares she has chronic pain on her left side.  She also endorses that her anxiety increases when she is in pain.  We discussed that patient has required sternal rub and painful stimuli in order for her to be awake and aroused.  It is unsafe at this time to give her medications for anxiety or pain as these would compromise not only  her respiratory status but her ability to be aroused.  Patient did not seem to understand and continue to ask for anxiety and pain meds.  I counseled with patient's dayshift RN who is in agreement that patient's mentation and respiratory status has been tenuous and is not appropriate to give additional antianxiety lytics or pain medications/CNS depressants at this time.  I spoke with patient's father over the phone.  He is patient's POA and surrogate decision maker.  He shares that he wants the patient to get better but that if this is her time then he is understanding of that.  He is in agreement for DNR and DNI to remain.  However, he wants to treat the treatable without aggressive measures to see if patient can "turn the corner".  We discussed patient's current illness and what it means in the larger context of patient's on-going co-morbidities.  Natural disease trajectory discussed.  The difference between aggressive medical intervention and comfort care was considered.  Patient's father shares that he wants to optimize patient medically without aggressive medical interventions.  He also appreciates that if these medical interventions cannot improve her status that he would be in agreement when appropriate to shift to comfort measures.  However, patient is not currently appropriate  for comfort measures as she is showing improvement.  Patient's father was grateful for this update and remains available by phone.  He does not plan to visit unless patient's health deteriorates.  Education offered regarding concept specific to human mortality and the limitations of medical interventions to prolong life when the body begins to fail to thrive.  Discussed with patient and her father the importance of continued conversation with family and the medical providers regarding overall plan of care and treatment options, ensuring decisions are within the context of the patient's values and GOCs.    Questions and  concerns were addressed. The family was encouraged to call with questions or concerns.   PMT will continue to follow the patient.  Please note that there is no in person PMT provider over the weekend.  My colleagues will be chart checking and following up with patient and family on Monday.  Primary Decision Maker PATIENT  Physical Exam Vitals reviewed.  Constitutional:      General: She is not in acute distress.    Appearance: She is obese. She is not ill-appearing.  Cardiovascular:     Rate and Rhythm: Normal rate.  Pulmonary:     Effort: Pulmonary effort is normal.     Comments: [place] in place Abdominal:     Palpations: Abdomen is soft.  Musculoskeletal:     Comments: Bedbound with left sided weakness in LUE and LLE  Neurological:     Mental Status: She is alert.     Comments: Oriented to person and situation     Palliative Assessment/Data: 30%     Thank you for this consult. Palliative medicine will continue to follow and assist holistically.   Time Total: 75 minutes Greater than 50%  of this time was spent counseling and coordinating care related to the above assessment and plan.  Signed by: Jordan Hawks, DNP, FNP-BC Palliative Medicine    Please contact Palliative Medicine Team phone at [phone] for questions and concerns.  For individual provider: See Shea Evans

## 2022-04-04 NOTE — Progress Notes (Signed)
Pt pulled bipap off face stated she was tired of wearing it, placed on 12L HFNC, sats 92%, tolerating well at this time.

## 2022-04-04 NOTE — ED Notes (Addendum)
The pt was lying supine in the bed with her head slightly elevated. The pt is warm, pink, and dry. The pt was not alert and oriented. Attempted sternal rub without success. ED nurse Meagan applied pressure to the pt's left thumb. The pt did respond to the painful stimuli. The pt however went back to sleep and remained hard to arouse. The pt's attending physician was notified. Resp placed the pt back on bipap and abg was collected.

## 2022-04-04 NOTE — Progress Notes (Signed)
Transported patient to icu without incident.  Patient tolerated transport well. Placed patient on 12L HFNC for break. Bipap on standby.

## 2022-04-05 ENCOUNTER — Inpatient Hospital Stay: Payer: Medicare Other

## 2022-04-05 DIAGNOSIS — I272 Pulmonary hypertension, unspecified: Secondary | ICD-10-CM | POA: Diagnosis not present

## 2022-04-05 DIAGNOSIS — J9621 Acute and chronic respiratory failure with hypoxia: Secondary | ICD-10-CM | POA: Diagnosis not present

## 2022-04-05 DIAGNOSIS — J101 Influenza due to other identified influenza virus with other respiratory manifestations: Secondary | ICD-10-CM | POA: Diagnosis not present

## 2022-04-05 LAB — MRSA NEXT GEN BY PCR, NASAL: MRSA by PCR Next Gen: DETECTED — AB

## 2022-04-05 MED ORDER — PANTOPRAZOLE SODIUM 40 MG PO TBEC
40.0000 mg | DELAYED_RELEASE_TABLET | Freq: Every day | ORAL | Status: DC
  Administered 2022-04-06 – 2022-04-18 (×12): 40 mg via ORAL
  Filled 2022-04-05 (×12): qty 1

## 2022-04-05 MED ORDER — LORAZEPAM 0.5 MG PO TABS
0.5000 mg | ORAL_TABLET | Freq: Four times a day (QID) | ORAL | Status: DC | PRN
  Administered 2022-04-06 – 2022-04-07 (×3): 0.5 mg via ORAL
  Filled 2022-04-05 (×3): qty 1

## 2022-04-05 MED ORDER — MUPIROCIN 2 % EX OINT
1.0000 | TOPICAL_OINTMENT | Freq: Two times a day (BID) | CUTANEOUS | Status: AC
  Administered 2022-04-06 – 2022-04-10 (×10): 1 via NASAL
  Filled 2022-04-05: qty 22

## 2022-04-05 NOTE — Progress Notes (Signed)
PHARMACIST - PHYSICIAN COMMUNICATION  CONCERNING: IV to Oral Route Change Policy  RECOMMENDATION: This patient is receiving pantoprazole by the intravenous route.  Based on criteria approved by the Pharmacy and Therapeutics Committee, the intravenous medication(s) is/are being converted to the equivalent oral dose form(s).   DESCRIPTION: These criteria include: The patient is eating (either orally or via tube) and/or has been taking other orally administered medications for a least 24 hours The patient has no evidence of active gastrointestinal bleeding or impaired GI absorption (gastrectomy, short bowel, patient on TNA or NPO).  If you have questions about this conversion, please contact the Pharmacy Department   Tressie Ellis, [facility] 04/05/2022 2:22 PM

## 2022-04-05 NOTE — Progress Notes (Signed)
Patient is requesting Ativan for anxiety but there is not an order. Sharon Schwartz, NP notified of request. Ativan is not listed as a prior to admit medication. Steward Drone, NP would like an updated medication list before placing order. Request made to [facility] to fax an updated medication list. Awaiting medication list.

## 2022-04-05 NOTE — Progress Notes (Signed)
[facility]                                                                               Sharon Black, is a 51 y.o. female, DOB - 04-14-1970, TOI:[id number] Admit date - 04/01/2022    Outpatient Primary MD for the patient is [name], [name], DO  LOS - 4  days    Brief summary  51 year old morbidly obese female with chronic respiratory failure on 5 L of nasal cannula oxygen at baseline due to longstanding pulmonary hypertension as well as a history of morbid obesity, multiple PEs and CVA with residual weakness who presented to the ED on 12/26 for shortness of breath and found to be in acute resp distress from influenza A.  CTA on 12/27 does not show any acute PE but has secretions layering in the trachea extending into the bronchus intermedius with complete right middle and lower lobe collapse.    Assessment & Plan    Assessment and Plan:  Acute on chronic hypoxic and hypercapnic respiratory failure  Initially placed on BiPAP.  Treated with steroids, Tamiflu, antibiotics and nebulizers.  Aggressive pulmonary support.  IR followed up and follow-up eval found residual fluid, not enough to tap.  Since then, patient has been refusing BiPAP and was on 12 L high flow, able to be weaned down to 8 L.  Acute metabolic encephalopathy-resolved:  Secondary to hypercapnic respiratory failure.  Much improved.  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   Bipolar disorder/depression/anxiety Continue with Depakote Risperdal and sertraline if able to take by mouth.  Have resumed her as needed Ativan.  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 Morbid obesity Possibly underlying obesity hypoventilation syndrome and obstructive sleep apnea   Normocytic anemia Hemoglobin around 10, continue to monitor  Chronic diastolic heart failure:  Echocardiogram earlier this year showed preserved LVEF  with grade 1 diastolic dysfunction. Responded to Lasix.  BNP on 12/27 at 108.  Will recheck tomorrow.    Estimated body mass index is 54.67 kg/m as calculated from the following:   Height as of this encounter: 5\' 3"  (1.6 m).   Weight as of this encounter: 140 kg.  Code Status: DNR DVT Prophylaxis:  SCDs Start: 04/01/22 2051   Level of Care: Level of care: Progressive Family Communication: NONE AT BEDSIDE.   Disposition Plan: Eventual discharge to home.  Need to improve oxygenation first.  Procedures:  None.   Consultants:   Palliative care  Antimicrobials:   Anti-infectives (From admission, onward)    Start     Dose/Rate Route Frequency Ordered Stop   04/02/22 2100  cefTRIAXone (ROCEPHIN) 2 g in sodium chloride 0.9 % 100 mL IVPB        2 g 200 mL/hr over 30 Minutes Intravenous Every 24 hours 04/02/22 1501 04/05/22 2359   04/02/22 2000  azithromycin (ZITHROMAX) 500 mg in sodium chloride 0.9 % 250 mL IVPB        500 mg 250 mL/hr over 60 Minutes Intravenous Every 24 hours 04/02/22 1500 04/06/22 1959   04/02/22 1515  cefTRIAXone (ROCEPHIN) 2 g in sodium  chloride 0.9 % 100 mL IVPB  Status:  Discontinued        2 g 200 mL/hr over 30 Minutes Intravenous Every 24 hours 04/02/22 1501 04/02/22 1501   04/01/22 2030  oseltamivir (TAMIFLU) capsule 75 mg        75 mg Oral 2 times daily 04/01/22 1935 04/06/22 2159   04/01/22 1930  cefTRIAXone (ROCEPHIN) 2 g in sodium chloride 0.9 % 100 mL IVPB        2 g 200 mL/hr over 30 Minutes Intravenous  Once 04/01/22 1928 04/01/22 2023   04/01/22 1930  azithromycin (ZITHROMAX) 500 mg in sodium chloride 0.9 % 250 mL IVPB        500 mg 250 mL/hr over 60 Minutes Intravenous  Once 04/01/22 1928 04/01/22 2130        Medications  Scheduled Meds:  aspirin  81 mg Oral Daily   atorvastatin  40 mg Oral QHS   Chlorhexidine Gluconate Cloth  6 each Topical Daily   diclofenac Sodium  2 g Topical QID   divalproex  250 mg Oral Daily   divalproex   500 mg Oral BID   docusate sodium  100 mg Oral Daily   enoxaparin  60 mg Subcutaneous Q12H   fluticasone  1 spray Each Nare Daily   guaiFENesin  600 mg Oral BID   ipratropium-albuterol  3 mL Nebulization Q6H   loratadine  10 mg Oral Daily   methylPREDNISolone (SOLU-MEDROL) injection  80 mg Intravenous Q24H   oseltamivir  75 mg Oral BID   [START ON 04/06/2022] pantoprazole  40 mg Oral Daily   pregabalin  50 mg Oral BID   Continuous Infusions:  sodium chloride 10 mL/hr at 04/05/22 1400   azithromycin (ZITHROMAX) 500 mg in sodium chloride 0.9 % 250 mL IVPB Stopped (04/04/22 2326)   cefTRIAXone (ROCEPHIN)  IV Stopped (04/04/22 2209)   PRN Meds:.sodium chloride, acetaminophen, albuterol, ibuprofen, ketorolac, loperamide, LORazepam    Subjective:   Sharon Black was seen and examined today.  Remains on BIPAP.   Objective:   Vitals:   04/05/22 1000 04/05/22 1100 04/05/22 1200 04/05/22 1300  BP: 105/67 107/70 107/68 113/66  Pulse: 79 84 93 91  Resp: (!) 25 20 (!) 27 (!) 23  Temp:      TempSrc:      SpO2: 100% 100% 100% 98%  Weight:      Height:        Intake/Output Summary (Last 24 hours) at 04/05/2022 1510 Last data filed at 04/05/2022 1400 Gross per 24 hour  Intake 943.33 ml  Output 100 ml  Net 843.33 ml    Filed Weights   04/01/22 1659  Weight: (!) 140 kg     Exam. General exam: Alert and oriented x 2-3, no acute distress, fatigued HEENT: Normocephalic, atraumatic, mucous membranes are moist Respiratory system: Bilateral rhonchi with some mild end expiratory wheeze Cardiovascular system: Regular rate and rhythm, S1-S2 Gastrointestinal system: Abdomen is nondistended, soft and nontender.  Central nervous system: alert , oriented to person and place.  Extremities: No clubbing or cyanosis, trace pitting edema Skin: No rashes,      Data Reviewed:   Sodium of 145.    Radiology Studies: [place] CHEST (PLEURAL EFFUSION)  Result Date: 04/05/2022 CLINICAL  DATA:  51 year old female with suspicion for right pleural effusion. EXAM: CHEST ULTRASOUND COMPARISON:  04/01/2022, 04/02/2022 FINDINGS: No evidence of bilateral pleural effusion. IMPRESSION: No evidence of bilateral pleural effusion. No indication for thoracentesis. Marliss Coots, MD  [facility] Radiology Electronically Signed   By: Marliss Coots M.D.   On: 04/05/2022 14:52      Hollice Espy M.D. [facility] 04/05/2022, 3:10 PM  Available via Epic secure chat 7am-7pm After 7 pm, please refer to night coverage provider listed on amion.

## 2022-04-05 NOTE — Progress Notes (Signed)
Request to IR for right thoracentesis - last imaging CTA chest 04/02/22 showed trace right pleural effusion with layering secretions in the trachea extending into the bronchus intermedius with complete right middle and lower lobe collapse.  [place] chest ordered today to assess for possible thoracentesis which shows no pleural effusion on the right and scant pleural effusion on the left which is not amenable to safe thoracentesis at this time.  No procedure planned, order for thoracentesis will be deleted. Discussed above with hospitalist via Epic chat.  Please place new order for thoracentesis if repeat evaluation is indicated.  Lynnette Caffey, PA-C

## 2022-04-05 NOTE — Progress Notes (Signed)
Patient urinary output 100 ml this shift. Bladder scan results show 132 ml of urine in bladder. Will continue to monitor.

## 2022-04-05 NOTE — Progress Notes (Signed)
Patient is refusing antibiotics (IV Rocephin and IV Zithromax). She states that they cause diarrhea. She has Immodium PRN ordered and it was offered prior to scheduled dose of antibiotics and she continued to refuse. Patient provided education concerning the importance of completing antibiotics and patient continued to refuse.Manuela Schwartz, NP notified, acknowledged. No new orders at this time.

## 2022-04-06 DIAGNOSIS — J9622 Acute and chronic respiratory failure with hypercapnia: Secondary | ICD-10-CM | POA: Diagnosis not present

## 2022-04-06 DIAGNOSIS — J101 Influenza due to other identified influenza virus with other respiratory manifestations: Secondary | ICD-10-CM | POA: Diagnosis not present

## 2022-04-06 DIAGNOSIS — J9601 Acute respiratory failure with hypoxia: Secondary | ICD-10-CM | POA: Diagnosis not present

## 2022-04-06 LAB — BRAIN NATRIURETIC PEPTIDE: B Natriuretic Peptide: 89.2 pg/mL (ref 0.0–100.0)

## 2022-04-06 LAB — PROCALCITONIN: Procalcitonin: <0.1 ng/mL

## 2022-04-06 LAB — CULTURE, BLOOD (ROUTINE X 2)
Culture: NO GROWTH
Culture: NO GROWTH
Special Requests: ADEQUATE
Special Requests: ADEQUATE

## 2022-04-06 LAB — BLOOD GAS, ARTERIAL
Acid-Base Excess: 17.8 mmol/L — ABNORMAL HIGH (ref 0.0–2.0)
Bicarbonate: 48.9 mmol/L — ABNORMAL HIGH (ref 20.0–28.0)
Delivery systems: POSITIVE
Expiratory PAP: 8 cmH2O
FIO2: 60 %
Inspiratory PAP: 18 cmH2O
Mechanical Rate: 10
O2 Saturation: 99.8 %
Patient temperature: 37
pCO2 arterial: 95 mmHg (ref 32–48)
pH, Arterial: 7.32 — ABNORMAL LOW (ref 7.35–7.45)
pO2, Arterial: 101 mmHg (ref 83–108)

## 2022-04-06 MED ORDER — ONDANSETRON HCL 4 MG/2ML IJ SOLN
4.0000 mg | Freq: Three times a day (TID) | INTRAMUSCULAR | Status: DC | PRN
  Administered 2022-04-06 – 2022-04-08 (×5): 4 mg via INTRAVENOUS
  Filled 2022-04-06 (×5): qty 2

## 2022-04-06 MED ORDER — ORAL CARE MOUTH RINSE: 15.0000 mL | OROMUCOSAL | Status: DC | PRN

## 2022-04-06 NOTE — Progress Notes (Signed)
Patient refused ABG at this time, Arther Dames, notified

## 2022-04-06 NOTE — Progress Notes (Signed)
[facility]                                                                               Sharon Black, is a 51 y.o. female, DOB - [birth year], TDD:[id number] Admit date - 04/01/2022    Outpatient Primary MD for the patient is [name], [name], DO  LOS - 5  days    Brief summary  51 year old morbidly obese female with chronic respiratory failure on 5 L of nasal cannula oxygen at baseline due to longstanding pulmonary hypertension as well as a history of morbid obesity, multiple PEs and CVA with residual weakness who presented to the ED on 12/26 for shortness of breath and found to be in acute resp distress from influenza A.  CTA on 12/27 does not show any acute PE but has secretions layering in the trachea extending into the bronchus intermedius with complete right middle and lower lobe collapse.    Assessment & Plan    Assessment and Plan:  Acute on chronic hypoxic and hypercapnic respiratory failure  Initially placed on BiPAP.  Treated with steroids, Tamiflu, antibiotics and nebulizers.  Aggressive pulmonary support.  IR followed up and follow-up eval found residual fluid, not enough to tap.  Since then, patient has been refusing BiPAP, however oxygenation continues to improve and she is currently on 3 L high flow  Acute metabolic encephalopathy-resolved:  Secondary to hypercapnic respiratory failure.   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   Bipolar disorder/depression/anxiety Continue with Depakote Risperdal and sertraline if able to take by mouth.  Have resumed her as needed Ativan.  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 Morbid obesity Possibly underlying obesity hypoventilation syndrome and obstructive sleep apnea   Normocytic anemia Hemoglobin around 10, continue to monitor  Chronic diastolic heart failure:  Echocardiogram earlier this year showed preserved  LVEF with grade 1 diastolic dysfunction. Responded to Lasix.  BNP staying low.    Estimated body mass index is 54.67 kg/m as calculated from the following:   Height as of this encounter: 5\' 3"  (1.6 m).   Weight as of this encounter: 140 kg.  Code Status: DNR DVT Prophylaxis:  SCDs Start: 04/01/22 2051   Level of Care: Level of care: Progressive Family Communication: Will call father  Disposition Plan: Eventual discharge to home.  Need to improve oxygenation first.  Procedures:  None.   Consultants:   Palliative care  Antimicrobials:   Anti-infectives (From admission, onward)    Start     Dose/Rate Route Frequency Ordered Stop   04/02/22 2100  cefTRIAXone (ROCEPHIN) 2 g in sodium chloride 0.9 % 100 mL IVPB        2 g 200 mL/hr over 30 Minutes Intravenous Every 24 hours 04/02/22 1501 04/05/22 2359   04/02/22 2000  azithromycin (ZITHROMAX) 500 mg in sodium chloride 0.9 % 250 mL IVPB        500 mg 250 mL/hr over 60 Minutes Intravenous Every 24 hours 04/02/22 1500 04/06/22 1959   04/02/22 1515  cefTRIAXone (ROCEPHIN) 2 g in sodium chloride 0.9 % 100 mL IVPB  Status:  Discontinued  2 g 200 mL/hr over 30 Minutes Intravenous Every 24 hours 04/02/22 1501 04/02/22 1501   04/01/22 2030  oseltamivir (TAMIFLU) capsule 75 mg        75 mg Oral 2 times daily 04/01/22 1935 04/06/22 2159   04/01/22 1930  cefTRIAXone (ROCEPHIN) 2 g in sodium chloride 0.9 % 100 mL IVPB        2 g 200 mL/hr over 30 Minutes Intravenous  Once 04/01/22 1928 04/01/22 2023   04/01/22 1930  azithromycin (ZITHROMAX) 500 mg in sodium chloride 0.9 % 250 mL IVPB        500 mg 250 mL/hr over 60 Minutes Intravenous  Once 04/01/22 1928 04/01/22 2130        Medications  Scheduled Meds:  aspirin  81 mg Oral Daily   atorvastatin  40 mg Oral QHS   Chlorhexidine Gluconate Cloth  6 each Topical Daily   diclofenac Sodium  2 g Topical QID   divalproex  250 mg Oral Daily   divalproex  500 mg Oral BID    docusate sodium  100 mg Oral Daily   enoxaparin  60 mg Subcutaneous Q12H   fluticasone  1 spray Each Nare Daily   guaiFENesin  600 mg Oral BID   ipratropium-albuterol  3 mL Nebulization Q6H   loratadine  10 mg Oral Daily   methylPREDNISolone (SOLU-MEDROL) injection  80 mg Intravenous Q24H   mupirocin ointment  1 Application Nasal BID   oseltamivir  75 mg Oral BID   pantoprazole  40 mg Oral Daily   pregabalin  50 mg Oral BID   Continuous Infusions:  sodium chloride Stopped (04/05/22 2004)   azithromycin (ZITHROMAX) 500 mg in sodium chloride 0.9 % 250 mL IVPB Stopped (04/04/22 2326)   PRN Meds:.sodium chloride, acetaminophen, albuterol, ibuprofen, ketorolac, loperamide, LORazepam, mouth rinse    Subjective:   Patient states breathing is okay, little better.  Objective:   Vitals:   04/06/22 1200 04/06/22 1300 04/06/22 1330 04/06/22 1400  BP: 106/69 109/72  (!) 109/58  Pulse: 96 93  100  Resp: 20 (!) 27  20  Temp:      TempSrc:      SpO2: 94% 97% 96% 99%  Weight:      Height:        Intake/Output Summary (Last 24 hours) at 04/06/2022 1416 Last data filed at 04/06/2022 0500 Gross per 24 hour  Intake 300.73 ml  Output 950 ml  Net -649.27 ml    Filed Weights   04/01/22 1659  Weight: (!) 140 kg     Exam. General exam: Alert and oriented x 2-3, no acute distress, fatigued HEENT: Normocephalic, atraumatic, mucous membranes are moist Respiratory system: Scattered rhonchi Cardiovascular system: Regular rate and rhythm, S1-S2 Gastrointestinal system: Abdomen is nondistended, soft and nontender.  Central nervous system: alert , oriented to person and place.  Extremities: No clubbing or cyanosis, trace pitting edema Skin: No rashes,      Data Reviewed:   BNP of 89, normal procalcitonin.  Radiology Studies: [place] CHEST (PLEURAL EFFUSION)  Result Date: 04/05/2022 CLINICAL DATA:  51 year old female with suspicion for right pleural effusion. EXAM: CHEST ULTRASOUND  COMPARISON:  04/01/2022, 04/02/2022 FINDINGS: No evidence of bilateral pleural effusion. IMPRESSION: No evidence of bilateral pleural effusion. No indication for thoracentesis. Ruthann Cancer, MD [facility] Radiology Electronically Signed   By: Ruthann Cancer M.D.   On: 04/05/2022 14:52      Annita Brod M.D. [facility]  04/06/2022, 2:16 PM  Available via Epic secure chat 7am-7pm After 7 pm, please refer to night coverage provider listed on amion.

## 2022-04-06 NOTE — Progress Notes (Deleted)
Per MD ok to use NG tube at 65cm. Per surgery MD ok to give meds down NG tube 

## 2022-04-07 DIAGNOSIS — I272 Pulmonary hypertension, unspecified: Secondary | ICD-10-CM | POA: Diagnosis not present

## 2022-04-07 DIAGNOSIS — J9622 Acute and chronic respiratory failure with hypercapnia: Secondary | ICD-10-CM | POA: Diagnosis not present

## 2022-04-07 DIAGNOSIS — J101 Influenza due to other identified influenza virus with other respiratory manifestations: Secondary | ICD-10-CM | POA: Diagnosis not present

## 2022-04-07 MED ORDER — METHYLPREDNISOLONE SODIUM SUCC 40 MG IJ SOLR
40.0000 mg | INTRAMUSCULAR | Status: DC
  Administered 2022-04-08: 40 mg via INTRAVENOUS
  Filled 2022-04-07: qty 1

## 2022-04-07 MED ORDER — HYDROCODONE-ACETAMINOPHEN 5-325 MG PO TABS
1.0000 | ORAL_TABLET | Freq: Four times a day (QID) | ORAL | Status: DC | PRN
  Administered 2022-04-07 – 2022-04-08 (×2): 1 via ORAL
  Filled 2022-04-07 (×2): qty 1

## 2022-04-07 NOTE — NC FL2 (Signed)
[place] LEVEL OF CARE FORM     IDENTIFICATION  Patient Name: Sharon Black Birthdate: 28-Aug-1970 Sex: female Admission Date (Current Location): 04/01/2022  [facility] and [place] Number:  Engineering geologist and Address:  [facility], [address] [place], [place] [address]      Provider Number: [id number]  Attending Physician Name and Address:  Annita Brod, MD  Relative Name and Phone Number:  Rosaria Ferries (father)  [phone]    Current Level of Care: Hospital Recommended Level of Care: [place] Prior Approval Number:    Date Approved/Denied:   PASRR Number: [id number]  Discharge Plan: SNF    Current Diagnoses: Patient Active Problem List   Diagnosis Date Noted   Acute respiratory failure with hypoxemia ([place]) 04/02/2022   Influenza A 04/02/2022   Respiratory failure with hypercapnia ([place]) 04/01/2022   Acute and chronic respiratory failure with hypoxia ([place]) 08/01/2021   Acute on chronic respiratory failure ([place]) 07/31/2021   Depression 07/31/2021   Pulmonary hypertension ([place])    Pulmonary emboli (HCC)    Morbid obesity ([place])    History of CVA with residual deficit    Acute CHF (congestive heart failure) (HCC)    SOB (shortness of breath)     Orientation RESPIRATION BLADDER Height & Weight     Self, Time, Situation, Place  O2 (4L nasal cannula) Incontinent, External catheter Weight: (!) 308 lb 10.3 oz (140 kg) Height:  5\' 3"  (160 cm)  BEHAVIORAL SYMPTOMS/MOOD NEUROLOGICAL BOWEL NUTRITION STATUS      Incontinent Diet (see discharge summary)  AMBULATORY STATUS COMMUNICATION OF NEEDS Skin   Extensive Assist Verbally Other (Comment) (open wound pelvis skin fold)                       Personal Care Assistance Level of Assistance  Bathing, Feeding, Dressing, Total care Bathing Assistance: Maximum assistance Feeding assistance: Maximum assistance Dressing Assistance: Maximum  assistance Total Care Assistance: Maximum assistance   Functional Limitations Info  Sight, Hearing, Speech Sight Info: Adequate Hearing Info: Adequate Speech Info: Adequate    SPECIAL CARE FACTORS FREQUENCY                       Contractures Contractures Info: Not present    Additional Factors Info  Code Status, Allergies Code Status Info: dnr Allergies Info: sulphamethoxydiazine, tetracyclines and related           Current Medications (04/07/2022):  This is the current hospital active medication list Current Facility-Administered Medications  Medication Dose Route Frequency Provider Last Rate Last Admin   0.9 %  sodium chloride infusion   Intravenous PRN Annita Brod, MD   Stopped at 04/05/22 2004   acetaminophen (TYLENOL) tablet 650 mg  650 mg Oral Q8H PRN Annita Brod, MD       albuterol (PROVENTIL) (2.5 MG/3ML) 0.083% nebulizer solution 2.5 mg  2.5 mg Nebulization Q2H PRN Annita Brod, MD   2.5 mg at 04/07/22 0500   aspirin chewable tablet 81 mg  81 mg Oral Daily Annita Brod, MD   81 mg at 04/07/22 0847   atorvastatin (LIPITOR) tablet 40 mg  40 mg Oral QHS Annita Brod, MD   40 mg at 04/06/22 2108   Chlorhexidine Gluconate Cloth 2 % PADS 6 each  6 each Topical Daily Annita Brod, MD   6 each at 04/07/22 1233   diclofenac Sodium (VOLTAREN) 1 %  topical gel 2 g  2 g Topical QID Annita Brod, MD   2 g at 04/07/22 0858   divalproex (DEPAKOTE SPRINKLE) capsule 250 mg  250 mg Oral Daily Annita Brod, MD   250 mg at 04/07/22 1231   divalproex (DEPAKOTE) DR tablet 500 mg  500 mg Oral BID Annita Brod, MD   500 mg at 04/07/22 0848   docusate sodium (COLACE) capsule 100 mg  100 mg Oral Daily Annita Brod, MD   100 mg at 04/07/22 0847   enoxaparin (LOVENOX) injection 60 mg  60 mg Subcutaneous Q12H Annita Brod, MD   60 mg at 04/07/22 0848   fluticasone (FLONASE) 50 MCG/ACT nasal spray 1 spray  1 spray Each Nare  Daily Annita Brod, MD   1 spray at 04/07/22 0850   guaiFENesin (MUCINEX) 12 hr tablet 600 mg  600 mg Oral BID Annita Brod, MD   600 mg at 04/07/22 0847   ibuprofen (ADVIL) tablet 400 mg  400 mg Oral Q6H PRN Annita Brod, MD   400 mg at 04/06/22 0940   ipratropium-albuterol (DUONEB) 0.5-2.5 (3) MG/3ML nebulizer solution 3 mL  3 mL Nebulization Q6H Annita Brod, MD   3 mL at 04/07/22 0708   loperamide (IMODIUM) capsule 2 mg  2 mg Oral PRN Annita Brod, MD   2 mg at 04/07/22 1234   loratadine (CLARITIN) tablet 10 mg  10 mg Oral Daily Annita Brod, MD   10 mg at 04/07/22 0847   LORazepam (ATIVAN) tablet 0.5 mg  0.5 mg Oral Q6H PRN Annita Brod, MD   0.5 mg at 04/07/22 0315   [START ON 04/08/2022] methylPREDNISolone sodium succinate (SOLU-MEDROL) 40 mg/mL injection 40 mg  40 mg Intravenous Q24H Annita Brod, MD       mupirocin ointment (BACTROBAN) 2 % 1 Application  1 Application Nasal BID Sharion Settler, NP   1 Application at 29/56/21 0850   ondansetron (ZOFRAN) injection 4 mg  4 mg Intravenous Q8H PRN Annita Brod, MD   4 mg at 04/07/22 0254   Oral care mouth rinse  15 mL Mouth Rinse PRN Sharion Settler, NP       pantoprazole (PROTONIX) EC tablet 40 mg  40 mg Oral Daily Annita Brod, MD   40 mg at 04/07/22 0848   pregabalin (LYRICA) capsule 50 mg  50 mg Oral BID Annita Brod, MD   50 mg at 04/07/22 3086     Discharge Medications: Please see discharge summary for a list of discharge medications.  Relevant Imaging Results:  Relevant Lab Results:   Additional Information SSN: [id number]  Tiburcio Bash, LCSW

## 2022-04-07 NOTE — Progress Notes (Signed)
[facility]                                                                               Sharon Black, is a 52 y.o. female, DOB - 1970/08/12, [id number] Admit date - 04/01/2022    Outpatient Primary MD for the patient is Black, [name], DO  LOS - 6  days    Brief summary  52 year old morbidly obese female with chronic respiratory failure on 5 L of nasal cannula oxygen at baseline due to longstanding pulmonary hypertension as well as a history of morbid obesity, multiple PEs and CVA with residual weakness who presented to the ED on 12/26 for shortness of breath and found to be in acute resp distress from influenza A.  CTA on 12/27 does not show any acute PE but has secretions layering in the trachea extending into the bronchus intermedius with complete right middle and lower lobe collapse.    Assessment & Plan    Assessment and Plan:  Acute on chronic hypoxic and hypercapnic respiratory failure  Initially placed on BiPAP.  Treated with steroids, Tamiflu, antibiotics and nebulizers.  Aggressive pulmonary support.  IR followed up and follow-up eval found residual fluid, not enough to tap.  Since then, patient has been refusing BiPAP, however oxygenation continues to improve.  Patient had been down to 3 L high flow nasal cannula and then converted over to 4 L regular oxygen.  Acute metabolic encephalopathy-resolved:  Secondary to hypercapnic respiratory failure.   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   Bipolar disorder/depression/anxiety Continue with Depakote Risperdal and sertraline if able to take by mouth.  Have resumed her as needed Ativan.  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 Morbid obesity Possibly underlying obesity hypoventilation syndrome and obstructive sleep apnea   Normocytic anemia Hemoglobin around 10, continue to monitor  Chronic diastolic  heart failure:  Echocardiogram earlier this year showed preserved LVEF with grade 1 diastolic dysfunction. Responded to Lasix.  BNP staying low.    Estimated body mass index is 54.67 kg/m as calculated from the following:   Height as of this encounter: 5\' 3"  (1.6 m).   Weight as of this encounter: 140 kg.  Code Status: DNR DVT Prophylaxis:  SCDs Start: 04/01/22 2051   Level of Care: Level of care: Progressive Family Communication: Will call father  Disposition Plan: Plan to return back to skilled nursing facility, possibly 1/2  Procedures:  None.   Consultants:   Palliative care  Antimicrobials:   Anti-infectives (From admission, onward)    Start     Dose/Rate Route Frequency Ordered Stop   04/02/22 2100  cefTRIAXone (ROCEPHIN) 2 g in sodium chloride 0.9 % 100 mL IVPB        2 g 200 mL/hr over 30 Minutes Intravenous Every 24 hours 04/02/22 1501 04/05/22 2359   04/02/22 2000  azithromycin (ZITHROMAX) 500 mg in sodium chloride 0.9 % 250 mL IVPB        500 mg 250 mL/hr over 60 Minutes Intravenous Every 24 hours 04/02/22 1500 04/06/22 1959   04/02/22 1515  cefTRIAXone (ROCEPHIN) 2 g  in sodium chloride 0.9 % 100 mL IVPB  Status:  Discontinued        2 g 200 mL/hr over 30 Minutes Intravenous Every 24 hours 04/02/22 1501 04/02/22 1501   04/01/22 2030  oseltamivir (TAMIFLU) capsule 75 mg        75 mg Oral 2 times daily 04/01/22 1935 04/06/22 2159   04/01/22 1930  cefTRIAXone (ROCEPHIN) 2 g in sodium chloride 0.9 % 100 mL IVPB        2 g 200 mL/hr over 30 Minutes Intravenous  Once 04/01/22 1928 04/01/22 2023   04/01/22 1930  azithromycin (ZITHROMAX) 500 mg in sodium chloride 0.9 % 250 mL IVPB        500 mg 250 mL/hr over 60 Minutes Intravenous  Once 04/01/22 1928 04/01/22 2130        Medications  Scheduled Meds:  aspirin  81 mg Oral Daily   atorvastatin  40 mg Oral QHS   Chlorhexidine Gluconate Cloth  6 each Topical Daily   diclofenac Sodium  2 g Topical QID    divalproex  250 mg Oral Daily   divalproex  500 mg Oral BID   docusate sodium  100 mg Oral Daily   enoxaparin  60 mg Subcutaneous Q12H   fluticasone  1 spray Each Nare Daily   guaiFENesin  600 mg Oral BID   ipratropium-albuterol  3 mL Nebulization Q6H   loratadine  10 mg Oral Daily   [START ON 04/08/2022] methylPREDNISolone (SOLU-MEDROL) injection  40 mg Intravenous Q24H   mupirocin ointment  1 Application Nasal BID   pantoprazole  40 mg Oral Daily   pregabalin  50 mg Oral BID   Continuous Infusions:  sodium chloride Stopped (04/05/22 2004)   PRN Meds:.sodium chloride, acetaminophen, albuterol, ibuprofen, loperamide, LORazepam, ondansetron (ZOFRAN) IV, mouth rinse    Subjective:   Patient feels like breathing continues to get better  Objective:   Vitals:   04/07/22 0712 04/07/22 0923 04/07/22 1235 04/07/22 1324  BP:  95/61  103/64  Pulse:  91  93  Resp:  20  18  Temp:  98.2 F (36.8 C)  98.9 F (37.2 C)  TempSrc:  Oral  Oral  SpO2: 94% 91% 90% 92%  Weight:      Height:        Intake/Output Summary (Last 24 hours) at 04/07/2022 1658 Last data filed at 04/07/2022 1235 Gross per 24 hour  Intake --  Output 800 ml  Net -800 ml    Filed Weights   04/01/22 1659  Weight: (!) 140 kg     Exam. General exam: Alert and oriented x 2-3, no acute distress, fatigued HEENT: Normocephalic, atraumatic, mucous membranes are moist Respiratory system: Few Rales Cardiovascular system: Regular rate and rhythm, S1-S2 Gastrointestinal system: Abdomen is nondistended, soft and nontender.  Central nervous system: alert , oriented to person and place.  Extremities: No clubbing or cyanosis, trace pitting edema Skin: No rashes,      Data Reviewed:   No labs today  Radiology Studies: No results found.    Annita Brod M.D. [facility] 04/07/2022, 4:58 PM  Available via Epic secure chat 7am-7pm After 7 pm, please refer to night coverage provider listed on  amion.

## 2022-04-07 NOTE — TOC Initial Note (Signed)
Transition of Care [facility]) - Initial/Assessment Note    Patient Details  Name: Sharon Black MRN: [id number] Date of Birth: 09/27/1970  Transition of Care [facility]) CM/SW Contact:    Tiburcio Bash, LCSW Phone Number: 04/07/2022, 12:54 PM  Clinical Narrative:                  Patient is LTC at [facility] with admissions at [facility] has been informed of patient's potential dc tomorrow vs Wednesday per MD.   Expected Discharge Plan: [place] Barriers to Discharge: Continued Medical Work up   Patient Goals and CMS Choice Patient states their goals for this hospitalization and ongoing recovery are:: to go home CMS Medicare.gov Compare Post Acute Care list provided to:: Patient Choice offered to / list presented to : Patient [place] ownership interest in [facility].provided to:: Patient    Expected Discharge Plan and Services       Living arrangements for the past 2 months: [place] ([place])                                      Prior Living Arrangements/Services Living arrangements for the past 2 months: [place] ([place])                     Activities of Daily Living      Permission Sought/Granted                  Emotional Assessment              Admission diagnosis:  Influenza A [J10.1] Respiratory failure with hypercapnia (HCC) [J96.92] Acute respiratory failure with hypoxemia ([place]) [J96.01] Patient Active Problem List   Diagnosis Date Noted   Acute respiratory failure with hypoxemia ([place]) 04/02/2022   Influenza A 04/02/2022   Respiratory failure with hypercapnia ([place]) 04/01/2022   Acute and chronic respiratory failure with hypoxia ([place]) 08/01/2021   Acute on chronic respiratory failure ([place]) 07/31/2021   Depression 07/31/2021   Pulmonary hypertension ([place])    Pulmonary emboli (HCC)    Morbid obesity ([place])    History of CVA with residual  deficit    Acute CHF (congestive heart failure) (HCC)    SOB (shortness of breath)    PCP:  Hal Morales, DO Pharmacy:  No Pharmacies Listed    Social Determinants of Health (SDOH) Social History:   SDOH Interventions:     Readmission Risk Interventions     No data to display

## 2022-04-08 DIAGNOSIS — J101 Influenza due to other identified influenza virus with other respiratory manifestations: Secondary | ICD-10-CM | POA: Diagnosis not present

## 2022-04-08 DIAGNOSIS — Z7189 Other specified counseling: Secondary | ICD-10-CM

## 2022-04-08 DIAGNOSIS — Z515 Encounter for palliative care: Secondary | ICD-10-CM | POA: Diagnosis not present

## 2022-04-08 DIAGNOSIS — J9622 Acute and chronic respiratory failure with hypercapnia: Secondary | ICD-10-CM | POA: Diagnosis not present

## 2022-04-08 MED ORDER — HYDROCODONE-ACETAMINOPHEN 5-325 MG PO TABS
0.5000 | ORAL_TABLET | Freq: Four times a day (QID) | ORAL | Status: DC | PRN
  Administered 2022-04-08 – 2022-04-09 (×2): 0.5 via ORAL
  Filled 2022-04-08 (×2): qty 1

## 2022-04-08 MED ORDER — PREDNISONE 20 MG PO TABS
40.0000 mg | ORAL_TABLET | Freq: Every day | ORAL | Status: DC
  Administered 2022-04-09: 40 mg via ORAL
  Filled 2022-04-08: qty 2

## 2022-04-08 NOTE — Progress Notes (Signed)
[facility]                                                                               Kenneshia Rehm, is a 52 y.o. female, DOB - Nov 27, 1970, [id number] Admit date - 04/01/2022    Outpatient Primary MD for the patient is [name], [name], DO  LOS - 7  days    Brief summary  52 year old morbidly obese female with chronic respiratory failure on 5 L of nasal cannula oxygen at baseline due to longstanding pulmonary hypertension as well as a history of morbid obesity, multiple PEs and CVA with residual weakness who presented to the ED on 12/26 for shortness of breath and found to be in acute resp distress from influenza A.  CTA on 12/27 does not show any acute PE but has secretions layering in the trachea extending into the bronchus intermedius with complete right middle and lower lobe collapse.    Assessment & Plan    Assessment and Plan:  Acute on chronic hypoxic and hypercapnic respiratory failure  Initially placed on BiPAP.  Treated with steroids, Tamiflu, antibiotics and nebulizers.  Aggressive pulmonary support.  IR followed up and follow-up eval found residual fluid, not enough to tap.  Since then, patient has been refusing BiPAP, however oxygenation continues to improve.  Patient had been down to 3 L high flow nasal cannula and then converted over to 4 L regular oxygen.  Tolerating this although borderline oxygen desaturations at times.  Acute metabolic encephalopathy-resolved:  Secondary to hypercapnic respiratory failure.   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   Bipolar disorder/depression/anxiety Continue with Depakote Risperdal and sertraline if able to take by mouth.  Have resumed her as needed Ativan.  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 Morbid obesity Possibly underlying obesity hypoventilation syndrome and obstructive sleep apnea   Normocytic  anemia Hemoglobin around 10, continue to monitor  Chronic diastolic heart failure:  Echocardiogram earlier this year showed preserved LVEF with grade 1 diastolic dysfunction. Responded to Lasix.  BNP staying low.    Estimated body mass index is 54.67 kg/m as calculated from the following:   Height as of this encounter: 5\' 3"  (1.6 m).   Weight as of this encounter: 140 kg.  Code Status: DNR DVT Prophylaxis:  SCDs Start: 04/01/22 2051   Level of Care: Level of care: Progressive Family Communication: Will call father  Disposition Plan: Plan to return back to skilled nursing facility 1/3  Procedures:  None.   Consultants:   Palliative care  Antimicrobials:   Anti-infectives (From admission, onward)    Start     Dose/Rate Route Frequency Ordered Stop   04/02/22 2100  cefTRIAXone (ROCEPHIN) 2 g in sodium chloride 0.9 % 100 mL IVPB        2 g 200 mL/hr over 30 Minutes Intravenous Every 24 hours 04/02/22 1501 04/05/22 2359   04/02/22 2000  azithromycin (ZITHROMAX) 500 mg in sodium chloride 0.9 % 250 mL IVPB        500 mg 250 mL/hr over 60 Minutes Intravenous Every 24 hours 04/02/22 1500 04/06/22 1959  04/02/22 1515  cefTRIAXone (ROCEPHIN) 2 g in sodium chloride 0.9 % 100 mL IVPB  Status:  Discontinued        2 g 200 mL/hr over 30 Minutes Intravenous Every 24 hours 04/02/22 1501 04/02/22 1501   04/01/22 2030  oseltamivir (TAMIFLU) capsule 75 mg        75 mg Oral 2 times daily 04/01/22 1935 04/06/22 2159   04/01/22 1930  cefTRIAXone (ROCEPHIN) 2 g in sodium chloride 0.9 % 100 mL IVPB        2 g 200 mL/hr over 30 Minutes Intravenous  Once 04/01/22 1928 04/01/22 2023   04/01/22 1930  azithromycin (ZITHROMAX) 500 mg in sodium chloride 0.9 % 250 mL IVPB        500 mg 250 mL/hr over 60 Minutes Intravenous  Once 04/01/22 1928 04/01/22 2130        Medications  Scheduled Meds:  aspirin  81 mg Oral Daily   atorvastatin  40 mg Oral QHS   Chlorhexidine Gluconate Cloth  6 each  Topical Daily   diclofenac Sodium  2 g Topical QID   divalproex  250 mg Oral Daily   divalproex  500 mg Oral BID   docusate sodium  100 mg Oral Daily   enoxaparin  60 mg Subcutaneous Q12H   fluticasone  1 spray Each Nare Daily   guaiFENesin  600 mg Oral BID   ipratropium-albuterol  3 mL Nebulization Q6H   loratadine  10 mg Oral Daily   mupirocin ointment  1 Application Nasal BID   pantoprazole  40 mg Oral Daily   [START ON 04/09/2022] predniSONE  40 mg Oral Q breakfast   pregabalin  50 mg Oral BID   Continuous Infusions:  sodium chloride Stopped (04/05/22 2004)   PRN Meds:.sodium chloride, acetaminophen, albuterol, HYDROcodone-acetaminophen, ibuprofen, loperamide, LORazepam, ondansetron (ZOFRAN) IV, mouth rinse    Subjective:   Somnolent  Objective:   Vitals:   04/08/22 1100 04/08/22 1146 04/08/22 1200 04/08/22 1308  BP:  109/78    Pulse:  (!) 101    Resp: (!) 22 18 18    Temp:  97.9 F (36.6 C)    TempSrc:  Axillary    SpO2:  91%  (!) 89%  Weight:      Height:        Intake/Output Summary (Last 24 hours) at 04/08/2022 1549 Last data filed at 04/08/2022 1445 Gross per 24 hour  Intake 600 ml  Output 2025 ml  Net -1425 ml    Filed Weights   04/01/22 1659  Weight: (!) 140 kg     Exam. General exam: Somnolent, no acute distress HEENT: Normocephalic, atraumatic, mucous membranes are moist Respiratory system: Few Rales Cardiovascular system: Regular rate and rhythm, S1-S2 Gastrointestinal system: Abdomen is nondistended, soft and nontender, positive bowel sounds Central nervous system: No focal deficits Extremities: No clubbing or cyanosis, trace pitting edema Skin: No rashes,      Data Reviewed:   No labs today  Radiology Studies: No results found.    Annita Brod M.D. [facility] 04/08/2022, 3:49 PM  Available via Epic secure chat 7am-7pm After 7 pm, please refer to night coverage provider listed on amion.

## 2022-04-08 NOTE — TOC Progression Note (Signed)
Transition of Care [facility]) - Progression Note    Patient Details  Name: Sharon Black MRN: [id number] Date of Birth: December 04, 1970  Transition of Care [facility]) CM/SW [place], [place] Phone Number: 04/08/2022, 12:02 PM  Clinical Narrative:     Neoma Laming with WOM informed of patient's potential dc and return to [facility] tomorrow 1/3 per MD.  Expected Discharge Plan: [place] Barriers to Discharge: Continued Medical Work up  Expected Discharge Plan and [place] arrangements for the past 2 months: [place] ([place])                                       Social Determinants of Health (SDOH) Interventions    Readmission Risk Interventions     No data to display

## 2022-04-08 NOTE — Progress Notes (Signed)
Palliative: Ms. Bober is lying quietly in bed.  She appears acutely/chronically ill and quite frail.  She is resting comfortably, but wakes easily when I enter.  She is alert and oriented, able to make her needs known.  There is no family at bedside at this time.  Offer food and drink which Ms. Shanks declines.  Summary adjustments made for comfort.  No questions or concerns at this time.  We talked about returning to her long-term care facility.    Conference with attending, bedside nursing staff, transition of care team related to patient condition, needs, goals of care, disposition.  Plan: Continue to treat the treatable but no CPR or intubation.  Return to long-term care facility [facility] anticipation 1/3.  DNR/goldenrod form on chart.  Would benefit from outpatient palliative care.  25 minutes Quinn Axe, NP Palliative medicine team Team phone [phone] Greater than 50% of this time was spent counseling and coordinating care related to the above assessment and plan.

## 2022-04-08 NOTE — Progress Notes (Signed)
Pr sats were 88% on 4lnc , according to chart pt is chronically on 5L so increased to [facility] sats were 89-90% . Increased to [facility] sats are at 91%. Orders said to maintain at 88 or greater but pt very sleepy. Notified provider and stated to decrease to 5L [place] as long as sats maintain greater than 88%. Will continue to monitor, call bell within reach.

## 2022-04-08 NOTE — Plan of Care (Signed)

## 2022-04-09 ENCOUNTER — Inpatient Hospital Stay: Payer: Medicare Other

## 2022-04-09 DIAGNOSIS — I272 Pulmonary hypertension, unspecified: Secondary | ICD-10-CM | POA: Diagnosis not present

## 2022-04-09 DIAGNOSIS — J9621 Acute and chronic respiratory failure with hypoxia: Secondary | ICD-10-CM | POA: Diagnosis not present

## 2022-04-09 DIAGNOSIS — F419 Anxiety disorder, unspecified: Secondary | ICD-10-CM

## 2022-04-09 LAB — BASIC METABOLIC PANEL
Anion gap: 7 (ref 5–15)
BUN: 12 mg/dL (ref 6–20)
CO2: 43 mmol/L — ABNORMAL HIGH (ref 22–32)
Calcium: 8.4 mg/dL — ABNORMAL LOW (ref 8.9–10.3)
Chloride: 93 mmol/L — ABNORMAL LOW (ref 98–111)
Creatinine, Ser: 0.41 mg/dL — ABNORMAL LOW (ref 0.44–1.00)
GFR, Estimated: >60 mL/min (ref >60)
Glucose, Bld: 155 mg/dL — ABNORMAL HIGH (ref 70–99)
Potassium: 3.9 mmol/L (ref 3.5–5.1)
Sodium: 143 mmol/L (ref 135–145)

## 2022-04-09 LAB — BRAIN NATRIURETIC PEPTIDE: B Natriuretic Peptide: 66.7 pg/mL (ref 0.0–100.0)

## 2022-04-09 LAB — PROCALCITONIN: Procalcitonin: <0.1 ng/mL

## 2022-04-09 LAB — BLOOD GAS, ARTERIAL

## 2022-04-09 MED ORDER — METHYLPREDNISOLONE SODIUM SUCC 40 MG IJ SOLR
20.0000 mg | Freq: Once | INTRAMUSCULAR | Status: AC
  Administered 2022-04-09: 20 mg via INTRAVENOUS
  Filled 2022-04-09: qty 1

## 2022-04-09 MED ORDER — METHYLPREDNISOLONE SODIUM SUCC 40 MG IJ SOLR: 40.0000 mg | Freq: Every day | INTRAMUSCULAR | Status: DC

## 2022-04-09 MED ORDER — ACETAZOLAMIDE SODIUM 500 MG IJ SOLR
500.0000 mg | Freq: Once | INTRAMUSCULAR | Status: AC
  Administered 2022-04-09: 500 mg via INTRAVENOUS
  Filled 2022-04-09: qty 500

## 2022-04-09 MED ORDER — FUROSEMIDE 10 MG/ML IJ SOLN: 20.0000 mg | Freq: Two times a day (BID) | INTRAMUSCULAR | Status: DC

## 2022-04-09 MED ORDER — ONDANSETRON HCL 4 MG/2ML IJ SOLN
4.0000 mg | Freq: Four times a day (QID) | INTRAMUSCULAR | Status: DC | PRN
  Administered 2022-04-09 – 2022-04-17 (×11): 4 mg via INTRAVENOUS
  Filled 2022-04-09 (×11): qty 2

## 2022-04-09 MED ORDER — FUROSEMIDE 10 MG/ML IJ SOLN
20.0000 mg | Freq: Two times a day (BID) | INTRAMUSCULAR | Status: AC
  Administered 2022-04-09: 20 mg via INTRAVENOUS
  Filled 2022-04-09: qty 2

## 2022-04-09 NOTE — Progress Notes (Addendum)
Pt on BIPA and is NPO but has a scheduled medicine and is requesting sometthing for 9/10 on pt back.. NP Randol Kern made aware. Will continue to  monitor.  Update 2127: NP Randol Kern placed order. Will continue to monitor.

## 2022-04-09 NOTE — Plan of Care (Signed)

## 2022-04-09 NOTE — Progress Notes (Signed)
[facility]                                                                               Sharon Black, is a 52 y.o. female, DOB - [birth year], [id number] Admit date - 04/01/2022    Outpatient Primary MD for the patient is [name], [name], DO  LOS - 8  days    Brief summary  52 year old morbidly obese female with chronic respiratory failure on 5 L of nasal cannula oxygen at baseline due to longstanding pulmonary hypertension as well as a history of morbid obesity, multiple PEs and CVA with residual weakness who presented to the ED on 12/26 for shortness of breath and found to be in acute resp distress from influenza A.  CTA on 12/27 does not show any acute PE but has secretions layering in the trachea extending into the bronchus intermedius with complete right middle and lower lobe collapse.   Patient placed on BiPAP initially improved with steroids, Tamiflu, antibiotics and nebulizers.  Patient recovered and able to get close to her baseline of 4 L nasal cannula, however starting 1/2, started having oxygen desaturations quiring high flow to be reinstated and oxygen titrated up.  Currently on 12 L high nasal cannula.   Assessment & Plan    Assessment and Plan:  Acute on chronic hypoxic and hypercapnic respiratory failure  Initially placed on BiPAP.  Treated with steroids, Tamiflu, antibiotics and nebulizers.  Aggressive pulmonary support.  IR followed up and follow-up eval found residual fluid, not enough to tap.  Since then, patient has been refusing BiPAP, however oxygenation continues to improve.  Patient had been down to 3 L high flow nasal cannula and then converted over to 4 L regular oxygen.  However, in the last 24 to 36 hours, patient continues to have oxygen desaturations and is currently on 12 L high flow nasal cannula.  Etiology is unclear.  I have resumed IV steroids and stopped pain medication (in case respiratory drive is suppressed secondary to  somnolence.)  Checking chest x-ray, ABG and procalcitonin.  BNP unremarkable.  Acute metabolic encephalopathy-resolved:  Secondary to hypercapnic respiratory failure.   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   Bipolar disorder/depression/anxiety Continue with Depakote Risperdal and sertraline if able to take by mouth.  Have resumed her as needed Ativan.  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 Morbid obesity Possibly underlying obesity hypoventilation syndrome and obstructive sleep apnea   Normocytic anemia Hemoglobin around 10, continue to monitor  Chronic diastolic heart failure:  Echocardiogram earlier this year showed preserved LVEF with grade 1 diastolic dysfunction. Responded to Lasix.  BNP staying low.    Estimated body mass index is 54.67 kg/m as calculated from the following:   Height as of this encounter: 5\' 3"  (1.6 m).   Weight as of this encounter: 140 kg.  Code Status: DNR DVT Prophylaxis:  SCDs Start: 04/01/22 2051   Level of Care: Level of care: Progressive Family Communication: Will call father  Disposition Plan: Hoping to stabilize breathing so that patient can return back to skilled nursing.  Procedures:  None.  Consultants:   Palliative care  Antimicrobials:   Anti-infectives (From admission, onward)    Start     Dose/Rate Route Frequency Ordered Stop   04/02/22 2100  cefTRIAXone (ROCEPHIN) 2 g in sodium chloride 0.9 % 100 mL IVPB        2 g 200 mL/hr over 30 Minutes Intravenous Every 24 hours 04/02/22 1501 04/05/22 2359   04/02/22 2000  azithromycin (ZITHROMAX) 500 mg in sodium chloride 0.9 % 250 mL IVPB        500 mg 250 mL/hr over 60 Minutes Intravenous Every 24 hours 04/02/22 1500 04/06/22 1959   04/02/22 1515  cefTRIAXone (ROCEPHIN) 2 g in sodium chloride 0.9 % 100 mL IVPB  Status:  Discontinued        2 g 200 mL/hr over 30 Minutes Intravenous Every  24 hours 04/02/22 1501 04/02/22 1501   04/01/22 2030  oseltamivir (TAMIFLU) capsule 75 mg        75 mg Oral 2 times daily 04/01/22 1935 04/06/22 2159   04/01/22 1930  cefTRIAXone (ROCEPHIN) 2 g in sodium chloride 0.9 % 100 mL IVPB        2 g 200 mL/hr over 30 Minutes Intravenous  Once 04/01/22 1928 04/01/22 2023   04/01/22 1930  azithromycin (ZITHROMAX) 500 mg in sodium chloride 0.9 % 250 mL IVPB        500 mg 250 mL/hr over 60 Minutes Intravenous  Once 04/01/22 1928 04/01/22 2130        Medications  Scheduled Meds:  aspirin  81 mg Oral Daily   atorvastatin  40 mg Oral QHS   Chlorhexidine Gluconate Cloth  6 each Topical Daily   diclofenac Sodium  2 g Topical QID   divalproex  250 mg Oral Daily   divalproex  500 mg Oral BID   docusate sodium  100 mg Oral Daily   enoxaparin  60 mg Subcutaneous Q12H   fluticasone  1 spray Each Nare Daily   guaiFENesin  600 mg Oral BID   ipratropium-albuterol  3 mL Nebulization Q6H   loratadine  10 mg Oral Daily   methylPREDNISolone (SOLU-MEDROL) injection  20 mg Intravenous Once   mupirocin ointment  1 Application Nasal BID   pantoprazole  40 mg Oral Daily   pregabalin  50 mg Oral BID   Continuous Infusions:  sodium chloride Stopped (04/05/22 2004)   PRN Meds:.sodium chloride, acetaminophen, albuterol, ibuprofen, loperamide, LORazepam, ondansetron (ZOFRAN) IV, mouth rinse    Subjective:   Patient reports increased productive cough.  Objective:   Vitals:   04/09/22 0700 04/09/22 0848 04/09/22 1155 04/09/22 1408  BP:  101/73 (!) 131/116   Pulse:  96 (!) 107   Resp:  20 18   Temp:  (!) 97.4 F (36.3 C) 97.9 F (36.6 C)   TempSrc:   Oral   SpO2: 90% (!) 87% 90% (!) 88%  Weight:      Height:        Intake/Output Summary (Last 24 hours) at 04/09/2022 1422 Last data filed at 04/09/2022 1136 Gross per 24 hour  Intake 240 ml  Output 2475 ml  Net -2235 ml    Filed Weights   04/01/22 1659  Weight: (!) 140 kg     Exam. General  exam: Awake, no acute distress HEENT: Normocephalic, atraumatic, mucous membranes are moist Respiratory system: Mild end expiratory wheeze Cardiovascular system: Regular rate and rhythm, S1-S2 Gastrointestinal system: Abdomen is nondistended, soft and nontender, positive bowel sounds Central nervous system:  No focal deficits Extremities: No clubbing or cyanosis, trace pitting edema Skin: No rashes,      Data Reviewed:   BNP of 69.  Chest x-ray, basic metabolic panel, ABG, procalcitonin pending  Radiology Studies: No results found.    Hollice Espy M.D. [facility] 04/09/2022, 2:22 PM  Available via Epic secure chat 7am-7pm After 7 pm, please refer to night coverage provider listed on amion.

## 2022-04-10 DIAGNOSIS — Z515 Encounter for palliative care: Secondary | ICD-10-CM | POA: Diagnosis not present

## 2022-04-10 DIAGNOSIS — I272 Pulmonary hypertension, unspecified: Secondary | ICD-10-CM | POA: Diagnosis not present

## 2022-04-10 DIAGNOSIS — Z7189 Other specified counseling: Secondary | ICD-10-CM | POA: Diagnosis not present

## 2022-04-10 DIAGNOSIS — F419 Anxiety disorder, unspecified: Secondary | ICD-10-CM | POA: Diagnosis not present

## 2022-04-10 DIAGNOSIS — J9621 Acute and chronic respiratory failure with hypoxia: Secondary | ICD-10-CM | POA: Diagnosis not present

## 2022-04-10 DIAGNOSIS — J101 Influenza due to other identified influenza virus with other respiratory manifestations: Secondary | ICD-10-CM | POA: Diagnosis not present

## 2022-04-10 LAB — BLOOD GAS, ARTERIAL
Acid-Base Excess: 19 mmol/L — ABNORMAL HIGH (ref 0.0–2.0)
Bicarbonate: 48.3 mmol/L — ABNORMAL HIGH (ref 20.0–28.0)
Delivery systems: POSITIVE
Expiratory PAP: 10 cmH2O
FIO2: 65 %
Inspiratory PAP: 18 cmH2O
O2 Saturation: 98.6 %
Patient temperature: 37
RATE: 10 resp/min
pCO2 arterial: 78 mmHg (ref 32–48)
pH, Arterial: 7.4 (ref 7.35–7.45)
pO2, Arterial: 88 mmHg (ref 83–108)

## 2022-04-10 LAB — VALPROIC ACID LEVEL: Valproic Acid Lvl: 43 ug/mL — ABNORMAL LOW (ref 50.0–100.0)

## 2022-04-10 MED ORDER — ACETAZOLAMIDE SODIUM 500 MG IJ SOLR
500.0000 mg | Freq: Once | INTRAMUSCULAR | Status: AC
  Administered 2022-04-10: 500 mg via INTRAVENOUS
  Filled 2022-04-10: qty 500

## 2022-04-10 MED ORDER — ACETAZOLAMIDE SODIUM 500 MG IJ SOLR
500.0000 mg | Freq: Two times a day (BID) | INTRAMUSCULAR | Status: DC
  Administered 2022-04-10 – 2022-04-11 (×3): 500 mg via INTRAVENOUS
  Filled 2022-04-10 (×4): qty 500

## 2022-04-10 MED ORDER — PREGABALIN 25 MG PO CAPS
25.0000 mg | ORAL_CAPSULE | Freq: Two times a day (BID) | ORAL | Status: DC
  Administered 2022-04-10: 25 mg via ORAL
  Filled 2022-04-10: qty 1

## 2022-04-10 NOTE — TOC Progression Note (Signed)
Transition of Care [facility]) - Progression Note    Patient Details  Name: Sharon Black MRN: [id number] Date of Birth: 07/09/1970  Transition of Care [facility]) CM/SW [place], [place] Phone Number: 04/10/2022, 4:07 PM  Clinical Narrative:     Pending medical readiness to return to [facility].   Expected Discharge Plan: [place] Barriers to Discharge: Continued Medical Work up  Expected Discharge Plan and [place] arrangements for the past 2 months: [place] ([place])                                       Social Determinants of Health (SDOH) Interventions    Readmission Risk Interventions     No data to display

## 2022-04-10 NOTE — Progress Notes (Signed)
[facility]                                                                               Sharon Black, is a 52 y.o. female, DOB - 1970-04-30, GNF:[id number] Admit date - 04/01/2022    Outpatient Primary MD for the patient is [name], [name], DO  LOS - 9  days    Brief summary  51 year old morbidly obese female with chronic respiratory failure on 5 L of nasal cannula oxygen at baseline due to longstanding pulmonary hypertension as well as a history of morbid obesity, multiple PEs and CVA with residual weakness who presented to the ED on 12/26 for shortness of breath and found to be in acute resp distress from influenza A.  CTA on 12/27 does not show any acute PE but has secretions layering in the trachea extending into the bronchus intermedius with complete right middle and lower lobe collapse.   Patient placed on BiPAP initially improved with steroids, Tamiflu, antibiotics and nebulizers.  Patient recovered and able to get close to her baseline of 4 L nasal cannula, however starting 1/2, started having oxygen desaturations quiring high flow to be reinstated and oxygen titrated up as high as 12 L high flow nasal cannula.  ABG checked and patient found to be with significant acute respiratory acidosis and metabolic alkalosis.  Placed on BiPAP and started on Diamox.   Assessment & Plan    Assessment and Plan:  Acute on chronic hypoxic and hypercapnic respiratory failure  Initially placed on BiPAP.  Treated with steroids, Tamiflu, antibiotics and nebulizers.  Aggressive pulmonary support.  IR followed up and follow-up eval found residual fluid, not enough to tap.  Since then, patient has been refusing BiPAP, however oxygenation continues to improve.  Patient had been down to 3 L high flow nasal cannula and then converted over to 4 L regular oxygen.  However, from 1/2 - 1/3, patient continued to have oxygen desaturations and oxygen steadily increased, up to 12 L high  flow nasal cannula.  ABG checked and found to have significant respiratory acidosis and metabolic alkalosis causing worsening respiratory failure.  Patient back on IV steroids.  Chest x-ray, procalcitonin and BNP unremarkable.  Placed back on continuous BiPAP and diuresed with Diamox.  ABG from 1/4 morning notes some improvement.  Acute metabolic encephalopathy-resolved:  Secondary to hypercapnic respiratory failure.   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   Bipolar disorder/depression/anxiety Continue with Depakote Risperdal and sertraline if able to take by mouth.  Have resumed her as needed Ativan.  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 Morbid obesity Possibly underlying obesity hypoventilation syndrome and obstructive sleep apnea   Normocytic anemia Hemoglobin around 10, continue to monitor  Chronic diastolic heart failure:  Echocardiogram earlier this year showed preserved LVEF with grade 1 diastolic dysfunction. Changing from Lasix to Diamox has noticed some improvement with patient diuresing several liters in the past 24 hours.    Estimated body mass index is 54.67 kg/m as calculated from the following:   Height as of this encounter: 5\' 3"  (1.6 m).   Weight  as of this encounter: 140 kg.  Code Status: DNR DVT Prophylaxis:  SCDs Start: 04/01/22 2051   Level of Care: Level of care: Progressive Family Communication: Will call father  Disposition Plan: If patient's breathing improved, from return back to her skilled nursing facility.  If not, need to look at comfort care. Procedures:  None.   Consultants:   Palliative care  Antimicrobials:   Anti-infectives (From admission, onward)    Start     Dose/Rate Route Frequency Ordered Stop   04/02/22 2100  cefTRIAXone (ROCEPHIN) 2 g in sodium chloride 0.9 % 100 mL IVPB        2 g 200 mL/hr over 30 Minutes Intravenous Every 24 hours  04/02/22 1501 04/05/22 2359   04/02/22 2000  azithromycin (ZITHROMAX) 500 mg in sodium chloride 0.9 % 250 mL IVPB        500 mg 250 mL/hr over 60 Minutes Intravenous Every 24 hours 04/02/22 1500 04/06/22 1959   04/02/22 1515  cefTRIAXone (ROCEPHIN) 2 g in sodium chloride 0.9 % 100 mL IVPB  Status:  Discontinued        2 g 200 mL/hr over 30 Minutes Intravenous Every 24 hours 04/02/22 1501 04/02/22 1501   04/01/22 2030  oseltamivir (TAMIFLU) capsule 75 mg        75 mg Oral 2 times daily 04/01/22 1935 04/06/22 2159   04/01/22 1930  cefTRIAXone (ROCEPHIN) 2 g in sodium chloride 0.9 % 100 mL IVPB        2 g 200 mL/hr over 30 Minutes Intravenous  Once 04/01/22 1928 04/01/22 2023   04/01/22 1930  azithromycin (ZITHROMAX) 500 mg in sodium chloride 0.9 % 250 mL IVPB        500 mg 250 mL/hr over 60 Minutes Intravenous  Once 04/01/22 1928 04/01/22 2130        Medications  Scheduled Meds:  aspirin  81 mg Oral Daily   atorvastatin  40 mg Oral QHS   Chlorhexidine Gluconate Cloth  6 each Topical Daily   diclofenac Sodium  2 g Topical QID   divalproex  250 mg Oral Daily   divalproex  500 mg Oral BID   docusate sodium  100 mg Oral Daily   enoxaparin  60 mg Subcutaneous Q12H   fluticasone  1 spray Each Nare Daily   guaiFENesin  600 mg Oral BID   ipratropium-albuterol  3 mL Nebulization Q6H   loratadine  10 mg Oral Daily   pantoprazole  40 mg Oral Daily   pregabalin  50 mg Oral BID   Continuous Infusions:  sodium chloride Stopped (04/05/22 2004)   PRN Meds:.sodium chloride, acetaminophen, albuterol, ibuprofen, loperamide, ondansetron (ZOFRAN) IV, mouth rinse    Subjective:   Patient somnolent, but to be awoken  Objective:   Vitals:   04/10/22 0742 04/10/22 0814 04/10/22 1200 04/10/22 1343  BP: 100/77  111/71   Pulse: 77 72 79   Resp: 18 18 18    Temp: (!) 97.4 F (36.3 C)  97.9 F (36.6 C)   TempSrc:      SpO2: 96% 96% 98% (!) 88%  Weight:      Height:         Intake/Output Summary (Last 24 hours) at 04/10/2022 1612 Last data filed at 04/10/2022 0021 Gross per 24 hour  Intake 240 ml  Output 650 ml  Net -410 ml    Filed Weights   04/01/22 1659  Weight: (!) 140 kg     Exam. General exam:  Awake, no acute distress HEENT: Normocephalic, atraumatic, mucous membranes are moist Respiratory system: Wheezing improved, decreased breath sounds throughout Cardiovascular system: Regular rate and rhythm, S1-S2 Gastrointestinal system: Abdomen is nondistended, soft and nontender, positive bowel sounds Central nervous system: No focal deficits Extremities: No clubbing or cyanosis, trace pitting edema Skin: No rashes,      Data Reviewed:   ABG from this morning notes pCO2 of 78 and bicarb of 48, both mildly improved from 12 hours prior.  Radiology Studies: DG Chest Port 1 View  Result Date: 04/09/2022 CLINICAL DATA:  Acute on chronic respiratory failure with hypoxia. EXAM: PORTABLE CHEST 1 VIEW COMPARISON:  April 01, 2022. FINDINGS: Stable cardiomediastinal silhouette. Moderate size right pleural effusion is again noted. Sternotomy wires are noted. Mild left basilar subsegmental atelectasis is noted. Bony thorax is unremarkable. IMPRESSION: Moderate size right pleural effusion is again noted. Mild left basilar subsegmental atelectasis. Electronically Signed   By: Marijo Conception M.D.   On: 04/09/2022 15:36      Annita Brod M.D. [facility] 04/10/2022, 4:12 PM  Available via Epic secure chat 7am-7pm After 7 pm, please refer to night coverage provider listed on amion.

## 2022-04-10 NOTE — Plan of Care (Signed)

## 2022-04-10 NOTE — Progress Notes (Signed)
Palliative: [name] is lying quietly in bed.  She appears acutely/chronically ill and quite frail.  Unfortunately, her respiratory status has worsened and she is now back on BiPAP.  Initially, she does not respond to sternal rub.  Bedside nursing staff is present attending to needs and we assist in turning her in the bed.  When she is turned, she awakens.  She will make and keep eye contact.  She is able to tell me her name, that we are in the hospital, that it is December.  We talk about her use of BiPAP.  At this point she tells me she does not want to wear BiPAP, but she is not ready for her life to be over.  We talk about more time for BiPAP.  Conference with attending and bedside nursing staff face-to-face related to patient condition, needs, goals of care.  Plan: Continue to treat the treatable but no CPR or intubation.  Time for outcomes.  Goal is to ultimately return to long-term care facility.  55 minutes   Quinn Axe, NP Palliative medicine team Team phone [phone] Greater than 50% of this time was spent counseling and coordinating care related to the above assessment and plan.

## 2022-04-10 NOTE — Plan of Care (Signed)

## 2022-04-11 DIAGNOSIS — F419 Anxiety disorder, unspecified: Secondary | ICD-10-CM | POA: Diagnosis not present

## 2022-04-11 DIAGNOSIS — Z7189 Other specified counseling: Secondary | ICD-10-CM | POA: Diagnosis not present

## 2022-04-11 DIAGNOSIS — I272 Pulmonary hypertension, unspecified: Secondary | ICD-10-CM | POA: Diagnosis not present

## 2022-04-11 DIAGNOSIS — J101 Influenza due to other identified influenza virus with other respiratory manifestations: Secondary | ICD-10-CM | POA: Diagnosis not present

## 2022-04-11 DIAGNOSIS — J9621 Acute and chronic respiratory failure with hypoxia: Secondary | ICD-10-CM | POA: Diagnosis not present

## 2022-04-11 LAB — BASIC METABOLIC PANEL
Anion gap: 8 (ref 5–15)
BUN: 15 mg/dL (ref 6–20)
CO2: 34 mmol/L — ABNORMAL HIGH (ref 22–32)
Calcium: 8.8 mg/dL — ABNORMAL LOW (ref 8.9–10.3)
Chloride: 97 mmol/L — ABNORMAL LOW (ref 98–111)
Creatinine, Ser: 0.39 mg/dL — ABNORMAL LOW (ref 0.44–1.00)
GFR, Estimated: >60 mL/min (ref >60)
Glucose, Bld: 83 mg/dL (ref 70–99)
Potassium: 3.1 mmol/L — ABNORMAL LOW (ref 3.5–5.1)
Sodium: 139 mmol/L (ref 135–145)

## 2022-04-11 LAB — CBC
HCT: 39.4 % (ref 36.0–46.0)
Hemoglobin: 11.3 g/dL — ABNORMAL LOW (ref 12.0–15.0)
MCH: 26 pg (ref 26.0–34.0)
MCHC: 28.7 g/dL — ABNORMAL LOW (ref 30.0–36.0)
MCV: 90.6 fL (ref 80.0–100.0)
Platelets: 307 10*3/uL (ref 150–400)
RBC: 4.35 MIL/uL (ref 3.87–5.11)
RDW: 16.7 % — ABNORMAL HIGH (ref 11.5–15.5)
WBC: 8.6 10*3/uL (ref 4.0–10.5)
nRBC: 0 % (ref 0.0–0.2)

## 2022-04-11 LAB — BLOOD GAS, VENOUS
Acid-Base Excess: 11.6 mmol/L — ABNORMAL HIGH (ref 0.0–2.0)
Bicarbonate: 41.8 mmol/L — ABNORMAL HIGH (ref 20.0–28.0)
O2 Saturation: 67.9 %
Patient temperature: 37
pCO2, Ven: 85 mmHg (ref 44–60)
pH, Ven: 7.3 (ref 7.25–7.43)
pO2, Ven: 48 mmHg — ABNORMAL HIGH (ref 32–45)

## 2022-04-11 MED ORDER — POTASSIUM CHLORIDE CRYS ER 20 MEQ PO TBCR
40.0000 meq | EXTENDED_RELEASE_TABLET | Freq: Once | ORAL | Status: AC
  Administered 2022-04-11: 40 meq via ORAL
  Filled 2022-04-11: qty 2

## 2022-04-11 MED ORDER — STERILE WATER FOR INJECTION IJ SOLN
INTRAMUSCULAR | Status: AC
  Administered 2022-04-11: 10 mL
  Filled 2022-04-11: qty 10

## 2022-04-11 NOTE — Progress Notes (Signed)
[facility]                                                                               Sharon Black, is a 52 y.o. female, DOB - [birth year], STM:[id number] Admit date - 04/01/2022    Outpatient Primary MD for the patient is [name], [name], DO  LOS - 10  days    Brief summary  52 year old morbidly obese female with chronic respiratory failure on 5 L of nasal cannula oxygen at baseline due to longstanding pulmonary hypertension as well as a history of morbid obesity, multiple PEs and CVA with residual weakness who presented to the ED on 12/26 for shortness of breath and found to be in acute resp distress from influenza A.  CTA on 12/27 does not show any acute PE but has secretions layering in the trachea extending into the bronchus intermedius with complete right middle and lower lobe collapse.   Patient placed on BiPAP initially improved with steroids, Tamiflu, antibiotics and nebulizers.  Patient recovered and able to get close to her baseline of 4 L nasal cannula, however starting 1/2, started having oxygen desaturations quiring high flow to be reinstated and oxygen titrated up as high as 12 L high flow nasal cannula.  ABG checked and patient found to be with significant acute respiratory acidosis and metabolic alkalosis.  Placed on BiPAP and started on Diamox.  She is now more awake, but attempts to wean her off of BiPAP due to continued oxygen desaturation.   Assessment & Plan    Assessment and Plan:  Acute on chronic hypoxic and hypercapnic respiratory failure  Initially placed on BiPAP.  Treated with steroids, Tamiflu, antibiotics and nebulizers.  Aggressive pulmonary support.  IR followed up and follow-up eval found residual fluid, not enough to tap.  Since then, patient has been refusing BiPAP, however oxygenation continues to improve.  Patient had been down to 3 L high flow nasal cannula and then converted over to 4 L regular oxygen.  However, from 1/2 -  1/3, patient continued to have oxygen desaturations and oxygen steadily increased, up to 12 L high flow nasal cannula.  ABG checked and found to have significant respiratory acidosis and metabolic alkalosis causing worsening respiratory failure.  Patient back on IV steroids.  Chest x-ray, procalcitonin and BNP unremarkable.  Placed back on continuous BiPAP and diuresed with Diamox.  ABG notes very little improvement.  Acute metabolic encephalopathy-resolved:  Secondary to hypercapnic respiratory failure.  Patient is now clearheaded, but cannot tolerate from a respiratory standpoint being on high flow oxygen for too long.  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   Bipolar disorder/depression/anxiety Continue with Depakote Risperdal and sertraline if able to take by mouth.  Have resumed her as needed Ativan.  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 Morbid obesity Possibly underlying obesity hypoventilation syndrome and obstructive sleep apnea   Normocytic anemia Hemoglobin around 10, continue to monitor  Chronic diastolic heart failure:  Echocardiogram earlier this year showed preserved LVEF with grade 1 diastolic dysfunction. Changing from Lasix to Diamox has noticed some improvement with patient diuresing  several liters in the past 24 hours.  She has not had much additional output in the last 24 hours, having diuresed a total of 7.6 L and is -6 L deficient.    Estimated body mass index is 54.67 kg/m as calculated from the following:   Height as of this encounter: 5\' 3"  (1.6 m).   Weight as of this encounter: 140 kg.  Code Status: DNR DVT Prophylaxis:  SCDs Start: 04/01/22 2051   Level of Care: Level of care: Progressive Family Communication: Will call father  Disposition Plan: If patient's breathing improved, from return back to her skilled nursing facility.  If not, need to look at comfort  care. Procedures:  None.   Consultants:   Palliative care  Antimicrobials:   Anti-infectives (From admission, onward)    Start     Dose/Rate Route Frequency Ordered Stop   04/02/22 2100  cefTRIAXone (ROCEPHIN) 2 g in sodium chloride 0.9 % 100 mL IVPB        2 g 200 mL/hr over 30 Minutes Intravenous Every 24 hours 04/02/22 1501 04/05/22 2359   04/02/22 2000  azithromycin (ZITHROMAX) 500 mg in sodium chloride 0.9 % 250 mL IVPB        500 mg 250 mL/hr over 60 Minutes Intravenous Every 24 hours 04/02/22 1500 04/06/22 1959   04/02/22 1515  cefTRIAXone (ROCEPHIN) 2 g in sodium chloride 0.9 % 100 mL IVPB  Status:  Discontinued        2 g 200 mL/hr over 30 Minutes Intravenous Every 24 hours 04/02/22 1501 04/02/22 1501   04/01/22 2030  oseltamivir (TAMIFLU) capsule 75 mg        75 mg Oral 2 times daily 04/01/22 1935 04/06/22 2159   04/01/22 1930  cefTRIAXone (ROCEPHIN) 2 g in sodium chloride 0.9 % 100 mL IVPB        2 g 200 mL/hr over 30 Minutes Intravenous  Once 04/01/22 1928 04/01/22 2023   04/01/22 1930  azithromycin (ZITHROMAX) 500 mg in sodium chloride 0.9 % 250 mL IVPB        500 mg 250 mL/hr over 60 Minutes Intravenous  Once 04/01/22 1928 04/01/22 2130        Medications  Scheduled Meds:  acetaZOLAMIDE  500 mg Intravenous Q12H   aspirin  81 mg Oral Daily   atorvastatin  40 mg Oral QHS   Chlorhexidine Gluconate Cloth  6 each Topical Daily   diclofenac Sodium  2 g Topical QID   divalproex  250 mg Oral Daily   divalproex  500 mg Oral BID   docusate sodium  100 mg Oral Daily   enoxaparin  60 mg Subcutaneous Q12H   fluticasone  1 spray Each Nare Daily   guaiFENesin  600 mg Oral BID   ipratropium-albuterol  3 mL Nebulization Q6H   loratadine  10 mg Oral Daily   pantoprazole  40 mg Oral Daily   potassium chloride  40 mEq Oral Once   Continuous Infusions:  sodium chloride Stopped (04/05/22 2004)   PRN Meds:.sodium chloride, acetaminophen, albuterol, ibuprofen,  loperamide, ondansetron (ZOFRAN) IV, mouth rinse    Subjective:   Patient somnolent, but to be awoken  Objective:   Vitals:   04/11/22 0300 04/11/22 0852 04/11/22 0854 04/11/22 1232  BP: 103/65 (!) 84/63 99/76 103/73  Pulse: 87 79 82 89  Resp:  20  20  Temp: 98 F (36.7 C) 97.8 F (36.6 C)  97.9 F (36.6 C)  TempSrc: Oral Oral  SpO2: 97% 91% 90% (!) 88%  Weight:      Height:        Intake/Output Summary (Last 24 hours) at 04/11/2022 1422 Last data filed at 04/10/2022 2300 Gross per 24 hour  Intake 120 ml  Output --  Net 120 ml    Filed Weights   04/01/22 1659  Weight: (!) 140 kg     Exam. General exam: Awake, on BiPAP HEENT: Normocephalic, atraumatic, mucous membranes are moist Respiratory system: Decreased breath sounds throughout, with no wheezing Cardiovascular system: Regular rate and rhythm, S1-S2 Gastrointestinal system: Abdomen is nondistended, soft and nontender, positive bowel sounds Central nervous system: No focal deficits Extremities: No clubbing or cyanosis, trace pitting edema Skin: No rashes,      Data Reviewed:   VBG from this morning notes pH 7.3 with pCO2 of 85 and bicarb of 48.  Radiology Studies: DG Chest Port 1 View  Result Date: 04/09/2022 CLINICAL DATA:  Acute on chronic respiratory failure with hypoxia. EXAM: PORTABLE CHEST 1 VIEW COMPARISON:  April 01, 2022. FINDINGS: Stable cardiomediastinal silhouette. Moderate size right pleural effusion is again noted. Sternotomy wires are noted. Mild left basilar subsegmental atelectasis is noted. Bony thorax is unremarkable. IMPRESSION: Moderate size right pleural effusion is again noted. Mild left basilar subsegmental atelectasis. Electronically Signed   By: Marijo Conception M.D.   On: 04/09/2022 15:36      Annita Brod M.D. [facility] 04/11/2022, 2:22 PM  Available via Epic secure chat 7am-7pm After 7 pm, please refer to night coverage provider listed on amion.

## 2022-04-11 NOTE — Plan of Care (Signed)
?  Problem: Health Behavior/Discharge Planning: ?Goal: Ability to manage health-related needs will improve ?Outcome: Progressing ?  ?Problem: Clinical Measurements: ?Goal: Will remain free from infection ?Outcome: Progressing ?  ?Problem: Clinical Measurements: ?Goal: Respiratory complications will improve ?Outcome: Progressing ?  ?Problem: Clinical Measurements: ?Goal: Cardiovascular complication will be avoided ?Outcome: Progressing ?  ?Problem: Pain Managment: ?Goal: General experience of comfort will improve ?Outcome: Progressing ?  ?Problem: Safety: ?Goal: Ability to remain free from injury will improve ?Outcome: Progressing ?  ?

## 2022-04-11 NOTE — Progress Notes (Signed)
Palliative: Ms. Grandmaison is lying quietly in bed.  She appears acutely/chronically ill and frail, obese.  Overall, she is resting comfortably with BiPAP in place.  Initially, she does not respond to gentle voice or touch.  Conference with attending, bedside nursing staff, transition of care team related to patient condition, needs, goals of care, disposition.  Plan: Continue to treat the treatable but no CPR or intubation.  More time on BiPAP.  Ultimate goal is to return to long-term care.  If no improvements, anticipate transition to comfort care.  37 minutes  Quinn Axe, NP Palliative medicine team Team phone [phone] Greater than 50% of this time was spent counseling and coordinating care related to the above assessment and plan.

## 2022-04-11 NOTE — Plan of Care (Signed)

## 2022-04-12 DIAGNOSIS — J9621 Acute and chronic respiratory failure with hypoxia: Secondary | ICD-10-CM | POA: Diagnosis not present

## 2022-04-12 DIAGNOSIS — Z7189 Other specified counseling: Secondary | ICD-10-CM | POA: Diagnosis not present

## 2022-04-12 DIAGNOSIS — J101 Influenza due to other identified influenza virus with other respiratory manifestations: Secondary | ICD-10-CM | POA: Diagnosis not present

## 2022-04-12 LAB — BLOOD GAS, VENOUS
Acid-Base Excess: 10.6 mmol/L — ABNORMAL HIGH (ref 0.0–2.0)
Bicarbonate: 40.3 mmol/L — ABNORMAL HIGH (ref 20.0–28.0)
O2 Saturation: 48.4 %
Patient temperature: 37
pCO2, Ven: 80 mmHg (ref 44–60)
pH, Ven: 7.31 (ref 7.25–7.43)
pO2, Ven: 35 mmHg (ref 32–45)

## 2022-04-12 LAB — BASIC METABOLIC PANEL
Anion gap: 9 (ref 5–15)
BUN: 13 mg/dL (ref 6–20)
CO2: 31 mmol/L (ref 22–32)
Calcium: 8.7 mg/dL — ABNORMAL LOW (ref 8.9–10.3)
Chloride: 100 mmol/L (ref 98–111)
Creatinine, Ser: 0.38 mg/dL — ABNORMAL LOW (ref 0.44–1.00)
GFR, Estimated: >60 mL/min (ref >60)
Glucose, Bld: 98 mg/dL (ref 70–99)
Potassium: 3.5 mmol/L (ref 3.5–5.1)
Sodium: 140 mmol/L (ref 135–145)

## 2022-04-12 MED ORDER — KETOROLAC TROMETHAMINE 15 MG/ML IJ SOLN
15.0000 mg | Freq: Once | INTRAMUSCULAR | Status: AC
  Administered 2022-04-12: 15 mg via INTRAVENOUS
  Filled 2022-04-12: qty 1

## 2022-04-12 MED ORDER — LIDOCAINE 5 % EX PTCH
2.0000 | MEDICATED_PATCH | CUTANEOUS | Status: DC
  Administered 2022-04-12: 1 via TRANSDERMAL
  Administered 2022-04-13 – 2022-04-17 (×5): 2 via TRANSDERMAL
  Filled 2022-04-12 (×6): qty 2

## 2022-04-12 NOTE — Progress Notes (Signed)
Patient currently on 6L HFNC,  SAT 96%, tolerating well. Patient declines Bipap at this time, stating her stomach is upset and prefers to stay on [place]. Patient is alert and oriented, no distress noted.

## 2022-04-12 NOTE — TOC Progression Note (Signed)
Transition of Care [facility]) - Progression Note    Patient Details  Name: Sharon Black MRN: [id number] Date of Birth: 1970-07-31  Transition of Care [facility]) CM/SW Contact  Izola Price, RN Phone Number: 04/12/2022, 3:15 PM  Clinical Narrative: 1/6: On HFNC at 6L. Issues wearing BiPap. ABG drawn result reported to provider per Unit RN. Simmie Davies RN CM     Expected Discharge Plan: Skilled Nursing Facility Barriers to Discharge: Continued Medical Work up  Expected Discharge Plan and Services       Living arrangements for the past 2 months: [place] ([place])                                       Social Determinants of Health (SDOH) Interventions    Readmission Risk Interventions     No data to display

## 2022-04-12 NOTE — Progress Notes (Addendum)
       CROSS COVER NOTE  NAME: Racquel Arkin MRN: [id number] DOB : 05/14/1970 ATTENDING PHYSICIAN: Annita Brod, MD    Date of Service   04/12/2022   HPI/Events of Note   Medication request received for home Lyrica and Percocet.  Per RN patient reports chronic back pain is not relieved by ordered ibuprofen.  RN reports patient is aware restarting home meds may make her drowsy and ultimately result in increased CO2 on ABG in the a.m. Patient does not want to go back on BiPAP regardless of what ABG might show in the morning.  Per [facility] rounder's note today all potentially sedating meds (anxiety meds, narcotics as well as Lyrica) were stopped.  Interventions   Assessment/Plan:  Toradol Lidocaine patch Kpad      To reach the provider On-Call:   7AM- 7PM see care teams to locate the attending and reach out to them via [email or web address]. 7PM-7AM contact night-coverage If you still have difficulty reaching the appropriate provider, please page the [facility] (Director on Call) for [facility] on amion for assistance  This document was prepared using Set designer software and may include unintentional dictation errors.  Neomia Glass DNP, MBA, FNP-BC Nurse Practitioner [facility] Pager [phone]

## 2022-04-12 NOTE — Progress Notes (Signed)
Made Dr. [place] Pink aware of abg resulted. Pco2 80, bicarb 40, ph 7.31, patient is refusing to wear bipap

## 2022-04-12 NOTE — Progress Notes (Signed)
[facility]                                                                               Sharon Black, is a 52 y.o. female, DOB - [birth year], DHR:[id number] Admit date - 04/01/2022    Outpatient Primary MD for the patient is Black, [name], DO  LOS - 11  days    Brief summary  52 year old morbidly obese female with chronic respiratory failure on 5 L of nasal cannula oxygen at baseline due to longstanding pulmonary hypertension as well as a history of morbid obesity, multiple PEs and CVA with residual weakness who presented to the ED on 12/26 for shortness of breath and found to be in acute resp distress from influenza A.  CTA on 12/27 does not show any acute PE but has secretions layering in the trachea extending into the bronchus intermedius with complete right middle and lower lobe collapse.   Patient placed on BiPAP initially improved with steroids, Tamiflu, antibiotics and nebulizers.  Patient recovered and able to get close to her baseline of 4 L nasal cannula, however starting 1/2, started having oxygen desaturations quiring high flow to be reinstated and oxygen titrated up as high as 12 L high flow nasal cannula.  ABG checked and patient found to be with significant acute respiratory acidosis and metabolic alkalosis.  Placed on BiPAP and started on Diamox.  Patient a bit more awake.  By 1/6 morning, wanted to be off of BiPAP and stay off.  ABG notes mild improvement in pCO2, down to 80.   Assessment & Plan    Assessment and Plan:  Acute on chronic hypoxic and hypercapnic respiratory failure  Initially placed on BiPAP.  Treated with steroids, Tamiflu, antibiotics and nebulizers.  Aggressive pulmonary support.  IR followed up and follow-up eval found residual fluid, not enough to tap.  Since then, patient has been refusing BiPAP, however oxygenation continues to improve.  Patient had been down to 3 L high flow nasal cannula and then converted over to 4 L  regular oxygen.  However, from 1/2 - 1/3, patient continued to have oxygen desaturations and oxygen steadily increased, up to 12 L high flow nasal cannula.  ABG checked and found to have significant respiratory acidosis and metabolic alkalosis causing worsening respiratory failure.  Patient back on IV steroids.  Chest x-ray, procalcitonin and BNP unremarkable.  Placed back on continuous BiPAP and diuresed with Diamox.  ABG notes mild improvement.  Metabolic alkalosis looks to have resolved, so we will stop Diamox.  Have also stopped all anxiety and narcotic medications as well as Lyrica, anything that could be sedating.  Acute metabolic encephalopathy-resolved:  Secondary to hypercapnic respiratory failure.  Patient is now clearheaded, but cannot tolerate from a respiratory standpoint being on high flow oxygen for too long.  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   Bipolar disorder/depression/anxiety Continue with Depakote Risperdal and sertraline if able to take by mouth.  Initially resumed her as needed Ativan, but after second episode of respiratory decompensation, have discontinued this medication.  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 Morbid  obesity Possibly underlying obesity hypoventilation syndrome and obstructive sleep apnea   Normocytic anemia Hemoglobin around 10, continue to monitor  Chronic diastolic heart failure:  Echocardiogram earlier this year showed preserved LVEF with grade 1 diastolic dysfunction. Changing from Lasix to Diamox has noticed some improvement with patient diuresing several liters in the past 24 hours.  She has not had much additional output in the last 24 hours, having diuresed a total of 8.2 L and is -7.2 L deficient.  Have stopped Diamox due to correction of metabolic alkalosis.    Estimated body mass index is 54.67 kg/m as calculated from the following:   Height as  of this encounter: 5\' 3"  (1.6 m).   Weight as of this encounter: 140 kg.  Code Status: DNR DVT Prophylaxis:  SCDs Start: 04/01/22 2051   Level of Care: Level of care: Progressive Family Communication: Will call father  Disposition Plan:   Patient wishes to not be on BiPAP anymore.  Currently on high flow nasal cannula at 5-6 L.  If patient's breathing continues to improve, return back to skilled nursing.  If not, transition to comfort care.  Palliative care following.  Improved, from return back to her skilled nursing facility.  If not, need to look at comfort care.  Procedures:  None.   Consultants:   Palliative care  Antimicrobials:   Anti-infectives (From admission, onward)    Start     Dose/Rate Route Frequency Ordered Stop   04/02/22 2100  cefTRIAXone (ROCEPHIN) 2 g in sodium chloride 0.9 % 100 mL IVPB        2 g 200 mL/hr over 30 Minutes Intravenous Every 24 hours 04/02/22 1501 04/05/22 2359   04/02/22 2000  azithromycin (ZITHROMAX) 500 mg in sodium chloride 0.9 % 250 mL IVPB        500 mg 250 mL/hr over 60 Minutes Intravenous Every 24 hours 04/02/22 1500 04/06/22 1959   04/02/22 1515  cefTRIAXone (ROCEPHIN) 2 g in sodium chloride 0.9 % 100 mL IVPB  Status:  Discontinued        2 g 200 mL/hr over 30 Minutes Intravenous Every 24 hours 04/02/22 1501 04/02/22 1501   04/01/22 2030  oseltamivir (TAMIFLU) capsule 75 mg        75 mg Oral 2 times daily 04/01/22 1935 04/06/22 2159   04/01/22 1930  cefTRIAXone (ROCEPHIN) 2 g in sodium chloride 0.9 % 100 mL IVPB        2 g 200 mL/hr over 30 Minutes Intravenous  Once 04/01/22 1928 04/01/22 2023   04/01/22 1930  azithromycin (ZITHROMAX) 500 mg in sodium chloride 0.9 % 250 mL IVPB        500 mg 250 mL/hr over 60 Minutes Intravenous  Once 04/01/22 1928 04/01/22 2130        Medications  Scheduled Meds:  aspirin  81 mg Oral Daily   atorvastatin  40 mg Oral QHS   Chlorhexidine Gluconate Cloth  6 each Topical Daily    diclofenac Sodium  2 g Topical QID   divalproex  250 mg Oral Daily   divalproex  500 mg Oral BID   docusate sodium  100 mg Oral Daily   enoxaparin  60 mg Subcutaneous Q12H   fluticasone  1 spray Each Nare Daily   guaiFENesin  600 mg Oral BID   ipratropium-albuterol  3 mL Nebulization Q6H   loratadine  10 mg Oral Daily   pantoprazole  40 mg Oral Daily   Continuous Infusions:  sodium  chloride Stopped (04/05/22 2004)   PRN Meds:.sodium chloride, acetaminophen, albuterol, ibuprofen, loperamide, ondansetron (ZOFRAN) IV, mouth rinse    Subjective:   Patient somnolent, but to be awoken  Objective:   Vitals:   04/12/22 0121 04/12/22 0615 04/12/22 0744 04/12/22 1221  BP:  (!) 112/46 (!) 88/61 127/76  Pulse:  86 96 94  Resp:  17 16 16   Temp:  98.3 F (36.8 C) 98.6 F (37 C) 98.6 F (37 C)  TempSrc:  Oral  Oral  SpO2: 96% 96% 96% 95%  Weight:      Height:        Intake/Output Summary (Last 24 hours) at 04/12/2022 1633 Last data filed at 04/11/2022 1930 Gross per 24 hour  Intake --  Output 300 ml  Net -300 ml    Filed Weights   04/01/22 1659  Weight: (!) 140 kg     Exam. General exam: Somnolent HEENT: Normocephalic, atraumatic, mucous membranes are moist Respiratory system: Decreased breath sounds throughout, with no wheezing Cardiovascular system: Regular rate and rhythm, S1-S2 Gastrointestinal system: Abdomen is nondistended, soft and nontender, positive bowel sounds Central nervous system: No focal deficits Extremities: No clubbing or cyanosis, trace pitting edema Skin: No rashes,      Data Reviewed:   ABG this morning with pH 7.31, pCO2 with mild improvement at 80 and bicarb down to 40.  Radiology Studies: No results found.    04/03/22 M.D. [facility] 04/12/2022, 4:33 PM  Available via Epic secure chat 7am-7pm After 7 pm, please refer to night coverage provider listed on amion.

## 2022-04-12 NOTE — Progress Notes (Signed)
Requested K-pad from portable and there are currently none available. Patient on list to receive when one becomes available.

## 2022-04-13 DIAGNOSIS — J9621 Acute and chronic respiratory failure with hypoxia: Secondary | ICD-10-CM | POA: Diagnosis not present

## 2022-04-13 DIAGNOSIS — J9622 Acute and chronic respiratory failure with hypercapnia: Secondary | ICD-10-CM | POA: Diagnosis not present

## 2022-04-13 LAB — BASIC METABOLIC PANEL
Anion gap: 9 (ref 5–15)
BUN: 14 mg/dL (ref 6–20)
CO2: 30 mmol/L (ref 22–32)
Calcium: 8.6 mg/dL — ABNORMAL LOW (ref 8.9–10.3)
Chloride: 102 mmol/L (ref 98–111)
Creatinine, Ser: 0.37 mg/dL — ABNORMAL LOW (ref 0.44–1.00)
GFR, Estimated: >60 mL/min (ref >60)
Glucose, Bld: 94 mg/dL (ref 70–99)
Potassium: 3.3 mmol/L — ABNORMAL LOW (ref 3.5–5.1)
Sodium: 141 mmol/L (ref 135–145)

## 2022-04-13 LAB — BLOOD GAS, VENOUS
Acid-Base Excess: 8.4 mmol/L — ABNORMAL HIGH (ref 0.0–2.0)
Bicarbonate: 37.9 mmol/L — ABNORMAL HIGH (ref 20.0–28.0)
O2 Saturation: 56.5 %
Patient temperature: 37
pCO2, Ven: 77 mmHg (ref 44–60)
pH, Ven: 7.3 (ref 7.25–7.43)
pO2, Ven: 44 mmHg (ref 32–45)

## 2022-04-13 MED ORDER — KETOROLAC TROMETHAMINE 15 MG/ML IJ SOLN
15.0000 mg | Freq: Four times a day (QID) | INTRAMUSCULAR | Status: AC | PRN
  Administered 2022-04-13: 15 mg via INTRAVENOUS
  Filled 2022-04-13 (×2): qty 1

## 2022-04-13 MED ORDER — POTASSIUM CHLORIDE CRYS ER 20 MEQ PO TBCR
40.0000 meq | EXTENDED_RELEASE_TABLET | Freq: Once | ORAL | Status: AC
  Administered 2022-04-13: 40 meq via ORAL
  Filled 2022-04-13: qty 2

## 2022-04-13 MED ORDER — KETOROLAC TROMETHAMINE 15 MG/ML IJ SOLN
15.0000 mg | Freq: Four times a day (QID) | INTRAMUSCULAR | Status: AC | PRN
  Administered 2022-04-13 (×2): 15 mg via INTRAVENOUS
  Filled 2022-04-13 (×2): qty 1

## 2022-04-13 NOTE — Progress Notes (Signed)
PROGRESS NOTE Sharon Black  GEX:[id number] DOB: Feb 12, 1971 DOA: 04/01/2022 PCP: Sherol Dade, DO   Brief Narrative/Hospital Course: 52 year old morbidly obese female with chronic respiratory failure on 5 L of nasal cannula oxygen at baseline due to longstanding pulmonary hypertension as well as a history of morbid obesity, multiple PEs and CVA with residual weakness who presented to the ED on 12/26 for shortness of breath and found to be in acute resp distress from influenza A.  CTA on 12/27 does not show any acute PE but has secretions layering in the trachea extending into the bronchus intermedius with complete right middle and lower lobe collapse.  Patient placed on BiPAP initially improved with steroids, Tamiflu, antibiotics and nebulizers.  Patient recovered and able to get close to her baseline of 4 L nasal cannula, however starting 1/2, started having oxygen desaturations quiring high flow to be reinstated and oxygen titrated up as high as 12 L high flow nasal cannula.  ABG checked and patient found to be with significant acute respiratory acidosis and metabolic alkalosis.  Placed on BiPAP and started on Diamox.  Patient a bit more awake.  By 1/6 morning, wanted to be off of BiPAP and stay off.  ABG notes mild improvement in pCO2, down to 80> next day 1/7: She has been much more alert awake, pCO2 further down to 77.    Subjective: Seen and examined this morning. Nursing just gave Toradol, she is asking about her Lyrica she is alert awake oriented on baseline 4 L nasal cannula which is home setting. Has not been eating well. Overnight afebrile, down from HFNC 6 l to 4 L nasal cannula ABG this morning shows pCO2 down to 77 from 80 pH 7.3 labs shows potassium slightly low 3.3 stable renal function   Assessment and Plan: Principal Problem:   Acute on chronic respiratory failure with hypoxia and hypercapnia (HCC) Active Problems:   Pulmonary hypertension (HCC)   Depression    Morbid obesity (HCC)  Acute on chronic hypoxic and hypercapnic respiratory failure  Chronic pulmonary hypertension-on 4 L nasal cannula at home Morbidly obese patient possible OSA: Patient with chronic respiratory failure with chronic pulmonary hypertension/chronic diastolic CHF.  Initially presented with respiratory distress treated with BiPAP along with steroids Tamiflu antibiotics pulmonary support.  IR felt not much fluid to tap.  She has been having desaturation episodes and also hypercapnic respiratory failure.  Sedatives and psychotropic medication including her Lyrica and pain medication has been discontinued due to hypercapnia.  ABG showed somewhat improving CO2 she is alert awake, agrees with holding Lyrica pain medication for now.  Continue aggressive bronchodilators, antitussives, still having cough.  Palliative care consulted  Chronic diastolic CHF: Diamox has been stopped.  Monitor fluid status and diurese as tolerated Net IO Since Admission: -6,525.96 mL [04/13/22 1045]   Acute metabolic encephalopathy: Currently not awake oriented and resolved History of PE: CTA 12/27 no evidence of acute PE.  Continue her home Lovenox 60 mg every 12 hours  History of stroke Bedbound at baseline: Continue her aspirin, Lipitor.  Bipolar disorder Anxiety/depression: Mood appears stable continue on Depakote.  Resume Risperdal I n1-2 day if more aware  Normocytic anemia:hemoglobin stable  Hypokalemia. Replaced Morbid Obesity:Patient's Body mass index is 54.67 kg/m. : Will benefit with PCP follow-up,wt loss  Goals of care currently DNR: She reports she was in hospice prior to admission.  We are consulted, she understands if her respiratory status does not improve or continues to worsen she may be a candidate  for hospice/comfort measures  DVT prophylaxis: SCDs Start: 04/01/22 2051 Code Status:   Code Status: DNR Family Communication: plan of care discussed with patient at bedside. Patient  status is: Inpatient because of respiratory failure Level of care: Progressive   Dispo: The patient is from: home             Anticipated disposition: TBD bjective: Vitals last 24 hrs: Vitals:   04/12/22 1928 04/12/22 2353 04/13/22 0310 04/13/22 0822  BP: 100/73 113/69 120/72 100/71  Pulse: 98 (!) 102  93  Resp: 18 20 20    Temp: 98.9 F (37.2 C) 98.7 F (37.1 C) 98.6 F (37 C)   TempSrc: Oral Oral Oral   SpO2: 99% 92% 99% 94%  Weight:      Height:       Weight change:   Physical Examination: General exam: alert awake, older than stated age HEENT:Oral mucosa moist, Ear/Nose WNL grossly Respiratory system: bilaterally diminished BS, having coughing spells, no use of accessory muscle Cardiovascular system: S1 & S2 +, No JVD. Gastrointestinal system: Abdomen soft,NT,ND, BS+ Nervous System:Alert, awake, moving  ue WELL Extremities: LE edema MILD Skin: No rashes,no icterus. MSK: Normal muscle bulk,tone, power  Medications reviewed:  Scheduled Meds:  aspirin  81 mg Oral Daily   atorvastatin  40 mg Oral QHS   Chlorhexidine Gluconate Cloth  6 each Topical Daily   diclofenac Sodium  2 g Topical QID   divalproex  250 mg Oral Daily   divalproex  500 mg Oral BID   docusate sodium  100 mg Oral Daily   enoxaparin  60 mg Subcutaneous Q12H   fluticasone  1 spray Each Nare Daily   guaiFENesin  600 mg Oral BID   ipratropium-albuterol  3 mL Nebulization Q6H   lidocaine  2 patch Transdermal Q24H   loratadine  10 mg Oral Daily   pantoprazole  40 mg Oral Daily   Continuous Infusions:  sodium chloride Stopped (04/05/22 2004)    Diet Order             Diet Heart Room service appropriate? Yes; Fluid consistency: Thin  Diet effective now                  No intake or output data in the 24 hours ending 04/13/22 1038 Net IO Since Admission: -6,525.96 mL [04/13/22 1038]  Wt Readings from Last 3 Encounters:  04/01/22 (!) 140 kg  07/31/21 130.2 kg     Unresulted Labs (From  admission, onward)     Start     Ordered   04/13/22 0500  Blood gas, arterial  Tomorrow morning,   R        04/12/22 1639          data Reviewed: I have personally reviewed following labs and imaging studies CBC: Recent Labs  Lab 04/11/22 0740  WBC 8.6  HGB 11.3*  HCT 39.4  MCV 90.6  PLT [id number]   Basic Metabolic Panel: Recent Labs  Lab 04/09/22 1447 04/11/22 0740 04/12/22 0711 04/13/22 0552  NA 143 139 140 141  K 3.9 3.1* 3.5 3.3*  CL 93* 97* 100 102  CO2 43* 34* 31 30  GLUCOSE 155* 83 98 94  BUN 12 15 13 14   CREATININE 0.41* 0.39* 0.38* 0.37*  CALCIUM 8.4* 8.8* 8.7* 8.6*   Recent Labs  Lab 04/09/22 1447  PROCALCITON <0.10    Recent Results (from the past 240 hour(s))  MRSA Next Gen by PCR, Nasal  Status: Abnormal   Collection Time: 04/05/22  6:38 AM   Specimen: Nasal Mucosa; Nasal Swab  Result Value Ref Range Status   MRSA by PCR Next Gen DETECTED (A) NOT DETECTED Final    Comment: RESULT CALLED TO, READ BACK BY AND VERIFIED WITH: LESLIE MITTS @0845  04/05/22 MJU (NOTE) The GeneXpert MRSA Assay (FDA approved for NASAL specimens only), is one component of a comprehensive MRSA colonization surveillance program. It is not intended to diagnose MRSA infection nor to guide or monitor treatment for MRSA infections. Test performance is not FDA approved in patients less than 26 years old. Performed at [facility], [address] [place], [place] [address]     Antimicrobials: Anti-infectives (From admission, onward)    Start     Dose/Rate Route Frequency Ordered Stop   04/02/22 2100  cefTRIAXone (ROCEPHIN) 2 g in sodium chloride 0.9 % 100 mL IVPB        2 g 200 mL/hr over 30 Minutes Intravenous Every 24 hours 04/02/22 1501 04/05/22 2359   04/02/22 2000  azithromycin (ZITHROMAX) 500 mg in sodium chloride 0.9 % 250 mL IVPB        500 mg 250 mL/hr over 60 Minutes Intravenous Every 24 hours 04/02/22 1500 04/06/22 1959   04/02/22 1515  cefTRIAXone  (ROCEPHIN) 2 g in sodium chloride 0.9 % 100 mL IVPB  Status:  Discontinued        2 g 200 mL/hr over 30 Minutes Intravenous Every 24 hours 04/02/22 1501 04/02/22 1501   04/01/22 2030  oseltamivir (TAMIFLU) capsule 75 mg        75 mg Oral 2 times daily 04/01/22 1935 04/06/22 2159   04/01/22 1930  cefTRIAXone (ROCEPHIN) 2 g in sodium chloride 0.9 % 100 mL IVPB        2 g 200 mL/hr over 30 Minutes Intravenous  Once 04/01/22 1928 04/01/22 2023   04/01/22 1930  azithromycin (ZITHROMAX) 500 mg in sodium chloride 0.9 % 250 mL IVPB        500 mg 250 mL/hr over 60 Minutes Intravenous  Once 04/01/22 1928 04/01/22 2130      Culture/Microbiology    Component Value Date/Time   SDES BLOOD BLOOD RIGHT WRIST 04/01/2022 1848   SPECREQUEST  04/01/2022 1848    BOTTLES DRAWN AEROBIC AND ANAEROBIC Blood Culture adequate volume   CULT  04/01/2022 1848    NO GROWTH 5 DAYS Performed at [facility], [address] [place], [place] [address]    REPTSTATUS 04/06/2022 FINAL 04/01/2022 1848   Radiology Studies: No results found.   LOS: 12 days   Antonieta Pert, MD [facility]  04/13/2022, 10:38 AM

## 2022-04-13 NOTE — Hospital Course (Addendum)
52 year old morbidly obese female with chronic respiratory failure on 5 L of nasal cannula oxygen at baseline due to longstanding pulmonary hypertension as well as a history of morbid obesity, multiple PEs and CVA with residual weakness who presented to the ED on 12/26 for shortness of breath and found to be in acute resp distress from influenza A.  CTA on 12/27 does not show any acute PE but has secretions layering in the trachea extending into the bronchus intermedius with complete right middle and lower lobe collapse.  Patient placed on BiPAP initially improved with steroids, Tamiflu, antibiotics and nebulizers.  Patient recovered and able to get close to her baseline of 4 L nasal cannula, however starting 1/2, started having oxygen desaturations quiring high flow to be reinstated and oxygen titrated up as high as 12 L high flow nasal cannula.  ABG checked and patient found to be with significant acute respiratory acidosis and metabolic alkalosis.  Placed on BiPAP and started on Diamox.  Patient a bit more awake.  By 1/6 morning, wanted to be off of BiPAP and stay off.  ABG notes mild improvement in pCO2, down to 80> next day 1/7: She has been much more alert awake, pCO2 further down to 77.

## 2022-04-13 NOTE — Progress Notes (Signed)
Patient seen for abg. Patient awake and alert. Verified order with patient. Patient clearly stated no she did not want abg this morning. Will reach out to provider to let provider know.

## 2022-04-14 DIAGNOSIS — J9622 Acute and chronic respiratory failure with hypercapnia: Secondary | ICD-10-CM | POA: Diagnosis not present

## 2022-04-14 DIAGNOSIS — J9621 Acute and chronic respiratory failure with hypoxia: Secondary | ICD-10-CM | POA: Diagnosis not present

## 2022-04-14 MED ORDER — KETOROLAC TROMETHAMINE 15 MG/ML IJ SOLN
15.0000 mg | Freq: Once | INTRAMUSCULAR | Status: AC
  Administered 2022-04-14: 15 mg via INTRAVENOUS
  Filled 2022-04-14: qty 1

## 2022-04-14 MED ORDER — POTASSIUM CHLORIDE CRYS ER 20 MEQ PO TBCR
40.0000 meq | EXTENDED_RELEASE_TABLET | Freq: Once | ORAL | Status: AC
  Administered 2022-04-14: 40 meq via ORAL
  Filled 2022-04-14: qty 2

## 2022-04-14 MED ORDER — IPRATROPIUM-ALBUTEROL 0.5-2.5 (3) MG/3ML IN SOLN
3.0000 mL | Freq: Two times a day (BID) | RESPIRATORY_TRACT | Status: DC
  Administered 2022-04-14 – 2022-04-15 (×3): 3 mL via RESPIRATORY_TRACT
  Filled 2022-04-14 (×3): qty 3

## 2022-04-14 MED ORDER — ACETYLCYSTEINE 20 % IN SOLN
4.0000 mL | Freq: Two times a day (BID) | RESPIRATORY_TRACT | Status: DC
  Administered 2022-04-14 (×2): 4 mL via RESPIRATORY_TRACT
  Filled 2022-04-14 (×4): qty 4

## 2022-04-14 MED ORDER — TRAMADOL HCL 50 MG PO TABS
50.0000 mg | ORAL_TABLET | Freq: Four times a day (QID) | ORAL | Status: DC | PRN
  Administered 2022-04-14 – 2022-04-17 (×9): 50 mg via ORAL
  Filled 2022-04-14 (×9): qty 1

## 2022-04-14 MED ORDER — IPRATROPIUM-ALBUTEROL 0.5-2.5 (3) MG/3ML IN SOLN
3.0000 mL | Freq: Four times a day (QID) | RESPIRATORY_TRACT | Status: DC | PRN
  Filled 2022-04-14 (×4): qty 3

## 2022-04-14 NOTE — TOC Progression Note (Addendum)
Transition of Care [facility]) - Progression Note    Patient Details  Name: Karey Suthers MRN: [id number] Date of Birth: 11-Dec-1970  Transition of Care [facility]) CM/SW [place], [facility] Phone Number: 04/14/2022, 11:20 AM  Clinical Narrative:     Update 11:28: WOM ordering Bipap. MD updated.   CSW has reached out to Marvell at [facility] regarding if possible to order bipap for patient and if there are options for nose prongs vs face mask, pending response. Bipap settings put in note and sent to [facility] as well.   Expected Discharge Plan: [place] Barriers to Discharge: Continued Medical Work up  Expected Discharge Plan and [place] arrangements for the past 2 months: [place] ([place])                                       Social Determinants of Health (SDOH) Interventions    Readmission Risk Interventions     No data to display

## 2022-04-14 NOTE — Progress Notes (Signed)
PROGRESS NOTE    Sharon Black  ZOX:[id number] DOB: 03/24/1971 DOA: 04/01/2022 PCP: Sherol Dade, DO  [id number]  LOS: 13 days   Brief hospital course:   Assessment & Plan: 52 year old morbidly obese female with chronic respiratory failure on 5 L of nasal cannula oxygen at baseline due to longstanding pulmonary hypertension as well as a history of morbid obesity, multiple PEs and CVA with residual weakness who presented to the ED on 12/26 for shortness of breath and found to be in acute resp distress from influenza A.   Patient placed on BiPAP initially improved with steroids, Tamiflu, antibiotics and nebulizers.  Patient recovered and able to get close to her baseline of 4 L nasal cannula, however starting 1/2, started having oxygen desaturations quiring high flow to be reinstated and oxygen titrated up as high as 12 L high flow nasal cannula.  ABG checked and patient found to be with significant acute respiratory acidosis and metabolic alkalosis.  Placed on BiPAP and started on Diamox.  Patient a bit more awake.  By 1/6 morning, wanted to be off of BiPAP and stay off.  ABG notes mild improvement in pCO2, down to 80> next day 1/7: She has been much more alert awake, pCO2 further down to 77.    Acute on chronic hypoxic and hypercapnic respiratory failure  Chronic pulmonary hypertension-on 4 L nasal cannula at home Morbidly obese patient possible OSA: Patient with chronic respiratory failure with chronic pulmonary hypertension/chronic diastolic CHF.  Initially presented with respiratory distress treated with BiPAP along with steroids Tamiflu antibiotics pulmonary support.  IR felt not much fluid to tap.  She has been having desaturation episodes and also hypercapnic respiratory failure.  Sedatives and psychotropic medication including her Lyrica and pain medication has been discontinued due to hypercapnia.  ABG showed somewhat improving CO2 she is alert awake now. --start mucomyst  BID --set up nasal BiPAP at SNF   Chronic diastolic CHF: Diamox has been stopped.  Monitor fluid status and diurese as tolerated Net IO Since Admission: -6,525.96 mL [04/13/22 1045]    Mod right pleural effusion --per IR, not enough to tap  Acute metabolic encephalopathy: resolved  History of PE:  CTA 12/27 no evidence of acute PE.   Continue her home Lovenox 60 mg every 12 hours   History of stroke Bedbound at baseline: Continue her aspirin, Lipitor.   Bipolar disorder Anxiety/depression: Mood appears stable  continue on Depakote.   Resume Risperdal I n1-2 day if more aware   Normocytic anemia:hemoglobin stable   Hypokalemia. Replaced  Morbid Obesity: Body mass index is 54.67 kg/m.    Goals of care currently DNR: She reports she was in hospice prior to admission.  We are consulted, she understands if her respiratory status does not improve or continues to worsen she may be a candidate for hospice/comfort measures   DVT prophylaxis: Lovenox SQ Code Status: DNR  Family Communication:  Level of care: Progressive Dispo:   The patient is from: LTC SNF Anticipated d/c is to: LTC SNF Anticipated d/c date is: 1-2 days   Subjective and Interval History:  Pt complained of pain.  Had a lot of sputum that she couldn't bring up.  Pt said she refuses to wear BiPAP if it's a mask.   Objective: Vitals:   04/14/22 0832 04/14/22 1343 04/14/22 1455 04/14/22 1615  BP: 101/60  109/60 110/65  Pulse: 97  85 80  Resp: 19  (!) 22 16  Temp: 98.4 F (36.9 C)  98.1  F (36.7 C) 98 F (36.7 C)  TempSrc: Oral  Oral Oral  SpO2: 96% 96% 94% 94%  Weight:      Height:       No intake or output data in the 24 hours ending 04/14/22 1817 Filed Weights   04/01/22 1659  Weight: (!) 140 kg    Examination:   Constitutional: NAD, alert HEENT: conjunctivae and lids normal, EOMI CV: No cyanosis.   RESP: very phlegmy, on 2L Neuro: II - XII grossly intact.   Psych: depressed mood and  affect.     Data Reviewed: I have personally reviewed labs and imaging studies  Time spent: 50 minutes  Enzo Bi, MD [facility] If 7PM-7AM, please contact night-coverage 04/14/2022, 6:17 PM

## 2022-04-14 NOTE — Progress Notes (Signed)
   04/13/22 2350  Assess: MEWS Score  Temp 99.3 F (37.4 C)  BP 105/65  MAP (mmHg) 76  Pulse Rate 95  Resp (!) 28  SpO2 98 %  O2 Device Nasal Cannula  O2 Flow Rate (L/min) 2 L/min  Assess: MEWS Score  MEWS Temp 0  MEWS Systolic 0  MEWS Pulse 0  MEWS RR 2  MEWS LOC 0  MEWS Score 2  MEWS Score Color Yellow  Assess: SIRS CRITERIA  SIRS Temperature  0  SIRS Pulse 1  SIRS Respirations  1  SIRS WBC 0  SIRS Score Sum  2   NT Tammy did not report yellow MEWS to RN. Patient is admitted for Pneumonia, patient has been prescribed Bipap but refuses.

## 2022-04-14 NOTE — Progress Notes (Cosign Needed)
   04/11/22 0830  BiPAP/CPAP/SIPAP  BiPAP/CPAP/SIPAP Pt Type Adult  Mask Type Full face mask  Mask Size Medium  Set Rate 10 breaths/min  Respiratory Rate 24 breaths/min  IPAP 18 cmH20  EPAP 10 cmH2O  FiO2 (%) 40 %  Minute Ventilation 6  Peak Inspiratory Pressure (PIP) 18  Tidal Volume (Vt) 320  BiPAP/CPAP/SIPAP BiPAP  Patient Home Equipment No  Auto Titrate No  Press High Alarm 25 cmH2O  Press Low Alarm 2 cmH2O    Kelby Fam, [place], MSW, [place]

## 2022-04-15 DIAGNOSIS — J9621 Acute and chronic respiratory failure with hypoxia: Secondary | ICD-10-CM | POA: Diagnosis not present

## 2022-04-15 DIAGNOSIS — J9622 Acute and chronic respiratory failure with hypercapnia: Secondary | ICD-10-CM | POA: Diagnosis not present

## 2022-04-15 LAB — BLOOD GAS, ARTERIAL
Acid-Base Excess: 22.9 mmol/L — ABNORMAL HIGH (ref 0.0–2.0)
Bicarbonate: 52.5 mmol/L — ABNORMAL HIGH (ref 20.0–28.0)
O2 Saturation: 88.5 %
Patient temperature: 37
pCO2 arterial: 81 mmHg (ref 32–48)
pH, Arterial: 7.42 (ref 7.35–7.45)
pO2, Arterial: 57 mmHg — ABNORMAL LOW (ref 83–108)

## 2022-04-15 LAB — CBC
HCT: 39.5 % (ref 36.0–46.0)
Hemoglobin: 11.5 g/dL — ABNORMAL LOW (ref 12.0–15.0)
MCH: 25.8 pg — ABNORMAL LOW (ref 26.0–34.0)
MCHC: 29.1 g/dL — ABNORMAL LOW (ref 30.0–36.0)
MCV: 88.6 fL (ref 80.0–100.0)
Platelets: 235 10*3/uL (ref 150–400)
RBC: 4.46 MIL/uL (ref 3.87–5.11)
RDW: 16.6 % — ABNORMAL HIGH (ref 11.5–15.5)
WBC: 6.7 10*3/uL (ref 4.0–10.5)
nRBC: 0 % (ref 0.0–0.2)

## 2022-04-15 LAB — BASIC METABOLIC PANEL
Anion gap: 9 (ref 5–15)
BUN: 16 mg/dL (ref 6–20)
CO2: 30 mmol/L (ref 22–32)
Calcium: 8.8 mg/dL — ABNORMAL LOW (ref 8.9–10.3)
Chloride: 98 mmol/L (ref 98–111)
Creatinine, Ser: 0.36 mg/dL — ABNORMAL LOW (ref 0.44–1.00)
GFR, Estimated: >60 mL/min (ref >60)
Glucose, Bld: 94 mg/dL (ref 70–99)
Potassium: 4.4 mmol/L (ref 3.5–5.1)
Sodium: 137 mmol/L (ref 135–145)

## 2022-04-15 LAB — MAGNESIUM: Magnesium: 2.1 mg/dL (ref 1.7–2.4)

## 2022-04-15 MED ORDER — RISPERIDONE 0.5 MG PO TABS
0.5000 mg | ORAL_TABLET | Freq: Two times a day (BID) | ORAL | Status: DC
  Administered 2022-04-15 – 2022-04-18 (×6): 0.5 mg via ORAL
  Filled 2022-04-15 (×7): qty 1

## 2022-04-15 MED ORDER — IPRATROPIUM-ALBUTEROL 0.5-2.5 (3) MG/3ML IN SOLN
3.0000 mL | Freq: Two times a day (BID) | RESPIRATORY_TRACT | Status: DC
  Administered 2022-04-15 – 2022-04-16 (×4): 3 mL via RESPIRATORY_TRACT
  Filled 2022-04-15 (×2): qty 3

## 2022-04-15 MED ORDER — FUROSEMIDE 20 MG PO TABS
30.0000 mg | ORAL_TABLET | Freq: Every day | ORAL | Status: DC
  Administered 2022-04-16 – 2022-04-18 (×3): 30 mg via ORAL
  Filled 2022-04-15 (×3): qty 2

## 2022-04-15 MED ORDER — ACETYLCYSTEINE 20 % IN SOLN
4.0000 mL | Freq: Two times a day (BID) | RESPIRATORY_TRACT | Status: DC
  Administered 2022-04-15 (×2): 4 mL via RESPIRATORY_TRACT
  Filled 2022-04-15 (×8): qty 4

## 2022-04-15 MED ORDER — SERTRALINE HCL 50 MG PO TABS
100.0000 mg | ORAL_TABLET | Freq: Every morning | ORAL | Status: DC
  Administered 2022-04-15 – 2022-04-18 (×4): 100 mg via ORAL
  Filled 2022-04-15 (×4): qty 2

## 2022-04-15 MED ORDER — PREGABALIN 50 MG PO CAPS
50.0000 mg | ORAL_CAPSULE | Freq: Two times a day (BID) | ORAL | Status: DC
  Administered 2022-04-15 – 2022-04-18 (×7): 50 mg via ORAL
  Filled 2022-04-15 (×7): qty 1

## 2022-04-15 NOTE — Progress Notes (Signed)
PROGRESS NOTE    Sharon Black  ZOX:[id number] DOB: Sep 01, 1970 DOA: 04/01/2022 PCP: Hal Morales, DO  [id number]/[id number]-AA  LOS: 14 days   Brief hospital course:   Assessment & Plan: 52 year old morbidly obese female with chronic respiratory failure on 4 L of nasal cannula oxygen at baseline due to longstanding pulmonary hypertension as well as a history of morbid obesity, multiple PEs and CVA with residual weakness who presented to the ED on 12/26 for shortness of breath and found to be in acute resp distress from influenza A.   Patient placed on BiPAP initially improved with steroids, Tamiflu, antibiotics and nebulizers.  Patient recovered and able to get close to her baseline of 4 L nasal cannula, however starting 1/2, started having oxygen desaturations quiring high flow to be reinstated and oxygen titrated up as high as 12 L high flow nasal cannula.  ABG checked and patient found to be with significant acute respiratory acidosis and metabolic alkalosis.  Placed on BiPAP and started on Diamox.  Patient a bit more awake.  By 1/6 morning, wanted to be off of BiPAP and stay off.  ABG notes mild improvement in pCO2, down to 80> next day 1/7: She has been much more alert awake, pCO2 further down to 77.    Acute on chronic hypoxic and hypercapnic respiratory failure  Chronic pulmonary hypertension 4 L O2 at baseline Morbidly obese patient possible OSA: Patient with chronic respiratory failure with chronic pulmonary hypertension/chronic diastolic CHF.  Initially presented with respiratory distress treated with BiPAP along with steroids Tamiflu antibiotics pulmonary support.  She was having desaturation episodes and also hypercapnic respiratory failure.  Sedatives and psychotropic medication including her Lyrica and pain medication were held due to hypercapnia.  ABG showed somewhat improving CO2 and she is alert awake now.  O2 requirement now back to baseline 4L.  Pt started refusing BiPAP after  she became more awake due to intolerance of the mask.  Pt also has mucus that needs to be cleared. Plan: --cont mucomyst BID for at least another day --TOC requested nasal BiPAP at SNF   Chronic diastolic CHF:  --resume home oral lasix 30 mg daily    Mod right pleural effusion --per IR, not enough to tap  Acute metabolic encephalopathy: resolved  History of PE:  CTA 12/27 no evidence of acute PE.   Continue her home Lovenox 60 mg every 12 hours   History of stroke Bedbound at baseline: Continue her aspirin, Lipitor.   Bipolar disorder Anxiety/depression: Mood appears stable  continue on Depakote.   Resume Risperdal today  Chronic pain --cont home tramadol --resume Lyrica today   Normocytic anemia:hemoglobin stable   Hypokalemia. Replaced  Morbid Obesity: Body mass index is 54.67 kg/m.    Goals of care currently DNR: She reports she was in hospice prior to admission.  palliative consulted, she understands if her respiratory status does not improve or continues to worsen she may be a candidate for hospice/comfort measures   DVT prophylaxis: Lovenox SQ Code Status: DNR  Family Communication: father updated at bedside today Level of care: Progressive Dispo:   The patient is from: LTC SNF Anticipated d/c is to: LTC SNF Anticipated d/c date is: can be discharged if mucus significantly reduced   Subjective and Interval History:  Pt still sounded very "junky" even without a stethoscope.  Pt did start coughing up more sputum after Mucomyst neb twice yesterday, however, refused it this morning due to it "smelling bad."  Father at bedside told pt  she needs to take it, and she agreed.  Father also told pt she needs to take the BiPAP, and she agreed also.    Pt also complained of chronic pain.   Objective: Vitals:   04/15/22 0556 04/15/22 0751 04/15/22 0819 04/15/22 1641  BP: 116/77  101/65 107/69  Pulse: 87  94   Resp: 17  20 19   Temp: 97.9 F (36.6 C)  98 F (36.7  C) 98.8 F (37.1 C)  TempSrc: Oral  Oral Oral  SpO2: 91% 96% 93% 97%  Weight:      Height:        Intake/Output Summary (Last 24 hours) at 04/15/2022 1805 Last data filed at 04/15/2022 1446 Gross per 24 hour  Intake --  Output 300 ml  Net -300 ml   Filed Weights   04/01/22 1659  Weight: (!) 140 kg    Examination:   Constitutional: NAD, alert HEENT: conjunctivae and lids normal, EOMI CV: No cyanosis.   RESP: very phlegmy, on 4L Extremities: Mild edema in BLE SKIN: warm, dry Neuro: II - XII grossly intact.   Psych: Normal mood and affect.       Data Reviewed: I have personally reviewed labs and imaging studies  Time spent: 35 minutes  04/03/22, MD [facility] If 7PM-7AM, please contact night-coverage 04/15/2022, 6:05 PM

## 2022-04-15 NOTE — TOC Progression Note (Signed)
Transition of Care [facility]) - Progression Note    Patient Details  Name: Sharon Black MRN: [id number] Date of Birth: [birth year]  [facility] CM/SW [place], [place] Phone Number: 04/15/2022, 11:21 AM  Clinical Narrative:     Neoma Laming at [facility] reports they received the bipap for patient, pending medical readiness to dc potentially tomorrow.   Expected Discharge Plan: [place] Barriers to Discharge: Continued Medical Work up  Expected Discharge Plan and [place] arrangements for the past 2 months: [place] ([place])                                       Social Determinants of Health (SDOH) Interventions    Readmission Risk Interventions     No data to display

## 2022-04-15 NOTE — Progress Notes (Signed)
Pt continues to refuse Bipap at this time.  0150-Pt called for PRN SVN, pt still adamant about not using BIPAP. SVN given.

## 2022-04-16 DIAGNOSIS — G9341 Metabolic encephalopathy: Secondary | ICD-10-CM | POA: Insufficient documentation

## 2022-04-16 DIAGNOSIS — E876 Hypokalemia: Secondary | ICD-10-CM | POA: Insufficient documentation

## 2022-04-16 DIAGNOSIS — Z86711 Personal history of pulmonary embolism: Secondary | ICD-10-CM | POA: Insufficient documentation

## 2022-04-16 DIAGNOSIS — J690 Pneumonitis due to inhalation of food and vomit: Secondary | ICD-10-CM | POA: Insufficient documentation

## 2022-04-16 DIAGNOSIS — G894 Chronic pain syndrome: Secondary | ICD-10-CM | POA: Insufficient documentation

## 2022-04-16 DIAGNOSIS — J9622 Acute and chronic respiratory failure with hypercapnia: Secondary | ICD-10-CM | POA: Diagnosis not present

## 2022-04-16 DIAGNOSIS — J1100 Influenza due to unidentified influenza virus with unspecified type of pneumonia: Secondary | ICD-10-CM | POA: Insufficient documentation

## 2022-04-16 DIAGNOSIS — I272 Pulmonary hypertension, unspecified: Secondary | ICD-10-CM | POA: Diagnosis not present

## 2022-04-16 DIAGNOSIS — J9621 Acute and chronic respiratory failure with hypoxia: Secondary | ICD-10-CM | POA: Diagnosis not present

## 2022-04-16 MED ORDER — IPRATROPIUM-ALBUTEROL 0.5-2.5 (3) MG/3ML IN SOLN
3.0000 mL | Freq: Two times a day (BID) | RESPIRATORY_TRACT | Status: DC
  Administered 2022-04-17 – 2022-04-18 (×3): 3 mL via RESPIRATORY_TRACT
  Filled 2022-04-16 (×3): qty 3

## 2022-04-16 MED ORDER — IPRATROPIUM-ALBUTEROL 0.5-2.5 (3) MG/3ML IN SOLN
3.0000 mL | Freq: Four times a day (QID) | RESPIRATORY_TRACT | Status: DC
  Administered 2022-04-16: 3 mL via RESPIRATORY_TRACT
  Filled 2022-04-16: qty 3

## 2022-04-16 NOTE — Care Management Important Message (Signed)
Important Message  Patient Details  Name: Sharon Black MRN: [id number] Date of Birth: 05-27-1970   Medicare Important Message Given:  Other (see comment)  Attempted to reach patient via room phone [phone]) to review Medicare IM.  No answer upon attempt.   Dannette Barbara 04/16/2022, 10:57 AM

## 2022-04-16 NOTE — Evaluation (Signed)
Clinical/Bedside Swallow Evaluation Patient Details  Name: Bretta Fees MRN: [id number] Date of Birth: 12/25/1970  Today's Date: 04/16/2022 Time: SLP Start Time (ACUTE ONLY): 1500 SLP Stop Time (ACUTE ONLY): 1525 SLP Time Calculation (min) (ACUTE ONLY): 25 min  Past Medical History:  Past Medical History:  Diagnosis Date   Asthma    CHF (congestive heart failure) ([place])    Morbid obesity ([place])    Pulmonary emboli ([place])    Pulmonary hypertension ([place])    Past Surgical History: History reviewed. No pertinent surgical history. HPI:  52 year old morbidly obese female with chronic respiratory failure on 5 L of nasal cannula oxygen at baseline due to longstanding pulmonary hypertension as well as a history of morbid obesity, multiple PEs and CVA with residual weakness who presented to the ED on 12/26 for shortness of breath and found to be in acute resp distress from influenza A. Patient placed on BiPAP initially improved with steroids, Tamiflu, antibiotics and nebulizers.  Patient recovered and able to get close to her baseline of 4 L nasal cannula, however starting 1/2, started having oxygen desaturations quiring high flow to be reinstated and oxygen titrated up as high as 12 L high flow nasal cannula.  ABG checked and patient found to be with significant acute respiratory acidosis and metabolic alkalosis.  Placed on BiPAP and started on Diamox.  Patient a bit more awake.  By 1/6 morning, wanted to be off of BiPAP and stay off.  ABG notes mild improvement in pCO2, down to 80> next day 1/7: She has been much more alert awake, pCO2 further down to 77. CXR 04/09/22: Moderate size right pleural effusion is again noted. Mild left basilar subsegmental atelectasis.Per progress note 04/16/22: Patient also has significant CO2 retention,  this could be due to aspiration, but most likely patient has obstructive sleep apnea.  Patient was on BiPAP at admission, I will hold it for now as I am concerned about further  aspiration on BiPAP. Patient condition is critical, will monitor patient closely.  If condition does not improve quickly, I will repeat CT scan, ABG, consider bronchoscopy per pulmonology consult. Recent chest x-ray reported a moderate right-sided pleural effusion, I reviewed the images compared to the original chest x-ray, also compared to CT scan, this does not appear to be pleural effusion, most likely this is collapsed lung.    Assessment / Plan / Recommendation  Clinical Impression  Pt presents with suspected intact oropharyngeal swallow in the setting of concern for aspiration. Pt resting on 5L nasal canula, with O2 saturations maintained at 93-96 for duration of trials. Pt with noted audible secretions with breathing and congested cough at baseline. Oral motor exam WFL. Pt denied challenge with eating/drinking or history of recurrent PNA. Pt reported GERD symptoms and need for medication and avoidance of spicy/acidic foods.      PO trials completed with thin liquids (ice, tsp, and cup), puree, and regular solids. No overt or subtle s/sx pharyngeal dysphagia noted. No change to vocal quality across trials. Vitals stable for duration of trials. Oral phase was grossly intact and timely for manipulation and clearance.       Education shared regarding monitoring endurance/work of breathing during PO intake, optimizing upright positioning during/after intake, and general aspiration precautions (slow rate, small bites, and alert for PO intake). Pt reported understanding. Recommend continued regular solids and thin liquids diet with general aspiration precautions and intermittent supervision. Based on pt report of challenge with swallowing large pills, recommend cutting large pills when  able for ease of swallow.  RN and MD aware of recommendations. No further SLP services indicated- please re-consult if indicated. SLP Visit Diagnosis: Dysphagia, unspecified (R13.10)    Aspiration Risk  Mild aspiration  risk    Diet Recommendation   Regular solids and thin liquids   Medication Administration: Whole meds with liquid (vs cut if needed/able)    Other  Recommendations Oral Care Recommendations: Oral care BID    Recommendations for follow up therapy are one component of a multi-disciplinary discharge planning process, led by the attending physician.  Recommendations may be updated based on patient status, additional functional criteria and insurance authorization.  Follow up Recommendations No SLP follow up      Assistance Recommended at Discharge    Functional Status Assessment Patient has not had a recent decline in their functional status    Swallow Study   General Date of Onset: 04/16/22 HPI: 52 year old morbidly obese female with chronic respiratory failure on 5 L of nasal cannula oxygen at baseline due to longstanding pulmonary hypertension as well as a history of morbid obesity, multiple PEs and CVA with residual weakness who presented to the ED on 12/26 for shortness of breath and found to be in acute resp distress from influenza A. Patient placed on BiPAP initially improved with steroids, Tamiflu, antibiotics and nebulizers.  Patient recovered and able to get close to her baseline of 4 L nasal cannula, however starting 1/2, started having oxygen desaturations quiring high flow to be reinstated and oxygen titrated up as high as 12 L high flow nasal cannula.  ABG checked and patient found to be with significant acute respiratory acidosis and metabolic alkalosis.  Placed on BiPAP and started on Diamox.  Patient a bit more awake.  By 1/6 morning, wanted to be off of BiPAP and stay off.  ABG notes mild improvement in pCO2, down to 80> next day 1/7: She has been much more alert awake, pCO2 further down to 77. CXR 04/09/22: Moderate size right pleural effusion is again noted. Mild left basilar subsegmental atelectasis.Per progress note 04/16/22: Patient also has significant CO2 retention,  this could  be due to aspiration, but most likely patient has obstructive sleep apnea.  Patient was on BiPAP at admission, I will hold it for now as I am concerned about further aspiration on BiPAP. Patient condition is critical, will monitor patient closely.  If condition does not improve quickly, I will repeat CT scan, ABG, consider bronchoscopy per pulmonology consult. Recent chest x-ray reported a moderate right-sided pleural effusion, I reviewed the images compared to the original chest x-ray, also compared to CT scan, this does not appear to be pleural effusion, most likely this is collapsed lung. Type of Study: Bedside Swallow Evaluation Previous Swallow Assessment: none in chart Diet Prior to this Study: Regular;Thin liquids Temperature Spikes Noted: No (WBC 6.7) Respiratory Status: Nasal cannula (5 L) History of Recent Intubation: No Behavior/Cognition: Alert;Cooperative Oral Cavity Assessment: Dried secretions Oral Care Completed by SLP: Yes Oral Cavity - Dentition: Adequate natural dentition Vision: Functional for self-feeding Self-Feeding Abilities: Able to feed self;Needs set up Patient Positioning: Upright in bed Baseline Vocal Quality: Normal Volitional Cough: Congested Volitional Swallow: Able to elicit    Oral/Motor/Sensory Function Overall Oral Motor/Sensory Function: Within functional limits   Ice Chips Ice chips: Within functional limits   Thin Liquid Thin Liquid: Within functional limits    Nectar Thick Nectar Thick Liquid: Not tested   Honey Thick Honey Thick Liquid: Not tested  Puree Puree: Within functional limits   Solid    [place] Yanil Dawe Clapp  MS [facility] SLP Solid: Within functional limits      [place] J Clapp 04/16/2022,4:29 PM

## 2022-04-16 NOTE — Progress Notes (Addendum)
Progress Note   Patient: Sharon Black UXN:[id number] DOB: [birth year] DOA: 04/01/2022     15 DOS: the patient was seen and examined on 04/16/2022   Brief hospital course: 52 year old morbidly obese female with chronic respiratory failure on 5 L of nasal cannula oxygen at baseline due to longstanding pulmonary hypertension as well as a history of morbid obesity, multiple PEs and CVA with residual weakness who presented to the ED on 12/26 for shortness of breath and found to be in acute resp distress from influenza A.  CTA on 12/27 does not show any acute PE but has secretions layering in the trachea extending into the bronchus intermedius with complete right middle and lower lobe collapse.  Patient placed on BiPAP initially improved with steroids, Tamiflu, antibiotics and nebulizers.  Patient recovered and able to get close to her baseline of 4 L nasal cannula, however starting 1/2, started having oxygen desaturations quiring high flow to be reinstated and oxygen titrated up as high as 12 L high flow nasal cannula.  ABG checked and patient found to be with significant acute respiratory acidosis and metabolic alkalosis.  Placed on BiPAP and started on Diamox.  Patient a bit more awake.  By 1/6 morning, wanted to be off of BiPAP and stay off.  ABG notes mild improvement in pCO2, down to 80> next day 1/7: She has been much more alert awake, pCO2 further down to 77.  Assessment and Plan: Acute on chronic respiratory failure with hypoxemia and hypercapnia. Severe mucous plug with a collapsing of right middle and lower lobe. POA Likely aspiration pneumonia. Influenza A. Reviewed CT scan from admission, reviewed images.  Patient had large amount of fluid inside the trachea, causing collapsed lungs.  The most likely explanation is aspiration.  Patient still has significant amount of mucus production, could not cough up.  Will obtain speech therapy evaluation. I will start scheduled DuoNebs 4 times a day, in  addition to Mucomyst already started.  Hopefully this can clear up the secretions. Initially, patient procalcitonin level is not elevated, this appears to be chemical pneumonia.  I will recheck procalcitonin level again, we will start antibiotics if this is elevated. Patient still has significant shortness of breath at nighttime, prior BNP was not elevated, not appear to be secondary to volume overload. Patient also has significant CO2 retention,  this could be due to aspiration, but most likely patient has obstructive sleep apnea.  Patient was on BiPAP at admission, I will hold it for now as I am concerned about further aspiration on BiPAP. Patient condition is critical, will monitor patient closely.  If condition does not improve quickly, I will repeat CT scan, ABG, consider bronchoscopy per pulmonology consult. Recent chest x-ray reported a moderate right-sided pleural effusion, I reviewed the images compared to the original chest x-ray, also compared to CT scan, this does not appear to be pleural effusion, most likely this is collapsed lung.  Chronic diastolic congestive heart failure Pulmonary hypertension. History of PE. Patient is on therapeutic Lovenox, does not seem to have any volume overload.  History of stroke. Severe debility with bedbound status. Will transfer back to SNF when medically stable.  Morbid obesity. Diet and exercise as tolerated.       Subjective:  Patient had an episode of shortness of breath last night, had an increased oxygen requirement, tachycardia.  She has a cough, could not cough up anything.  No chest pain.  No fever or chills.  Physical Exam: Vitals:   04/16/22  1134 04/16/22 1224 04/16/22 1225 04/16/22 1355  BP: 94/67     Pulse: (!) 119     Resp: 16   18  Temp: 98.7 F (37.1 C)     TempSrc:      SpO2: (!) 81% (!) 87% 91%   Weight:      Height:       General exam: Appears calm and comfortable  Respiratory system: Decreased breathing sounds  with some rhonchi in the base. Respiratory effort normal. Cardiovascular system: S1 & S2 heard, RRR. No JVD, murmurs, rubs, gallops or clicks. No pedal edema. Gastrointestinal system: Abdomen is nondistended, soft and nontender. No organomegaly or masses felt. Normal bowel sounds heard. Central nervous system: Alert and oriented. No focal neurological deficits. Extremities: Symmetric 5 x 5 power. Skin: No rashes, lesions or ulcers Psychiatry: Judgement and insight appear normal. Mood & affect appropriate.   Data Reviewed:  X-ray, CT scan, independently reviewed the images. Reviewed all lab results.  Family Communication: could not reach father  Disposition: Status is: Inpatient Remains inpatient appropriate because: Severity of disease,   Planned Discharge Destination: Skilled nursing facility    Time spent: 60 minutes  Author: Sharen Hones, MD 04/16/2022 2:20 PM  For on call review [email or web address].

## 2022-04-17 DIAGNOSIS — J9622 Acute and chronic respiratory failure with hypercapnia: Secondary | ICD-10-CM | POA: Diagnosis not present

## 2022-04-17 DIAGNOSIS — Z515 Encounter for palliative care: Secondary | ICD-10-CM | POA: Diagnosis not present

## 2022-04-17 DIAGNOSIS — J690 Pneumonitis due to inhalation of food and vomit: Secondary | ICD-10-CM | POA: Diagnosis not present

## 2022-04-17 DIAGNOSIS — I272 Pulmonary hypertension, unspecified: Secondary | ICD-10-CM | POA: Diagnosis not present

## 2022-04-17 DIAGNOSIS — J101 Influenza due to other identified influenza virus with other respiratory manifestations: Secondary | ICD-10-CM | POA: Diagnosis not present

## 2022-04-17 DIAGNOSIS — J9621 Acute and chronic respiratory failure with hypoxia: Secondary | ICD-10-CM | POA: Diagnosis not present

## 2022-04-17 DIAGNOSIS — Z7189 Other specified counseling: Secondary | ICD-10-CM | POA: Diagnosis not present

## 2022-04-17 LAB — CBC
HCT: 39.3 % (ref 36.0–46.0)
Hemoglobin: 11.4 g/dL — ABNORMAL LOW (ref 12.0–15.0)
MCH: 25.6 pg — ABNORMAL LOW (ref 26.0–34.0)
MCHC: 29 g/dL — ABNORMAL LOW (ref 30.0–36.0)
MCV: 88.3 fL (ref 80.0–100.0)
Platelets: 171 10*3/uL (ref 150–400)
RBC: 4.45 MIL/uL (ref 3.87–5.11)
RDW: 16.5 % — ABNORMAL HIGH (ref 11.5–15.5)
WBC: 7.2 10*3/uL (ref 4.0–10.5)
nRBC: 0 % (ref 0.0–0.2)

## 2022-04-17 LAB — BASIC METABOLIC PANEL
Anion gap: 9 (ref 5–15)
BUN: 13 mg/dL (ref 6–20)
CO2: 31 mmol/L (ref 22–32)
Calcium: 8.2 mg/dL — ABNORMAL LOW (ref 8.9–10.3)
Chloride: 96 mmol/L — ABNORMAL LOW (ref 98–111)
Creatinine, Ser: 0.4 mg/dL — ABNORMAL LOW (ref 0.44–1.00)
GFR, Estimated: >60 mL/min (ref >60)
Glucose, Bld: 136 mg/dL — ABNORMAL HIGH (ref 70–99)
Potassium: 3.6 mmol/L (ref 3.5–5.1)
Sodium: 136 mmol/L (ref 135–145)

## 2022-04-17 LAB — PROCALCITONIN: Procalcitonin: <0.1 ng/mL

## 2022-04-17 LAB — MAGNESIUM: Magnesium: 1.5 mg/dL — ABNORMAL LOW (ref 1.7–2.4)

## 2022-04-17 MED ORDER — METRONIDAZOLE 500 MG/100ML IV SOLN
500.0000 mg | Freq: Two times a day (BID) | INTRAVENOUS | Status: DC
  Administered 2022-04-17 – 2022-04-18 (×2): 500 mg via INTRAVENOUS
  Filled 2022-04-17 (×3): qty 100

## 2022-04-17 MED ORDER — SODIUM CHLORIDE 0.9 % IV SOLN
1.0000 g | INTRAVENOUS | Status: DC
  Administered 2022-04-17: 1 g via INTRAVENOUS
  Filled 2022-04-17 (×2): qty 10

## 2022-04-17 MED ORDER — MAGNESIUM SULFATE 4 GM/100ML IV SOLN
4.0000 g | Freq: Once | INTRAVENOUS | Status: AC
  Administered 2022-04-17: 4 g via INTRAVENOUS
  Filled 2022-04-17: qty 100

## 2022-04-17 MED ORDER — SODIUM CHLORIDE 0.9 % IV SOLN
3.0000 g | Freq: Three times a day (TID) | INTRAVENOUS | Status: DC
  Filled 2022-04-17: qty 8

## 2022-04-17 NOTE — Progress Notes (Signed)
Manufacturing engineer [facility]) Hospital Liaison Note  Received request from Transitions of Care Manager, Caryl Pina, for hospice services at [place] after discharge. Chart and patient information under review by [facility] physician.   Spoke with Father/Marion to initiate education related to hospice philosophy, services, and team approach to care. Rosaria Ferries verbalized understanding of information given. Per discussion, the plan is for patient to discharge via [facility] once cleared to DC.    DME needs are provided by LTC facility/[facility]  Please send signed and completed DNR home with patient/family. Please provide prescriptions at discharge as needed to ensure ongoing symptom management.    [facility] information and contact numbers given to family & above information shared with TOC.   Please call with any questions/concerns.    Thank you for the opportunity to participate in this patient's care.   Daphene Calamity, MSW [facility] Liaison  [phone]

## 2022-04-17 NOTE — TOC Progression Note (Signed)
Transition of Care [facility]) - Progression Note    Patient Details  Name: Sharon Black MRN: [id number] Date of Birth: 1970-07-26  Transition of Care [facility]) CM/SW [place], [place] Phone Number: 04/17/2022, 9:00 AM  Clinical Narrative:     Per MD patient will return to [facility] in a few days pending medical readiness, CSW has updated [facility]. They do have bipap when medically stable to return.   Expected Discharge Plan: [place] Barriers to Discharge: Continued Medical Work up  Expected Discharge Plan and [place] arrangements for the past 2 months: [place] ([place])                                       Social Determinants of Health (SDOH) Interventions    Readmission Risk Interventions     No data to display

## 2022-04-17 NOTE — Progress Notes (Addendum)
Progress Note   Patient: Sharon Black LKG:[id number] DOB: Aug 13, 1970 DOA: 04/01/2022     16 DOS: the patient was seen and examined on 04/17/2022   Brief hospital course: 52 year old morbidly obese female with chronic respiratory failure on 5 L of nasal cannula oxygen at baseline due to longstanding pulmonary hypertension as well as a history of morbid obesity, multiple PEs and CVA with residual weakness who presented to the ED on 12/26 for shortness of breath and found to be in acute resp distress from influenza A.  CTA on 12/27 does not show any acute PE but has secretions layering in the trachea extending into the bronchus intermedius with complete right middle and lower lobe collapse.  Patient placed on BiPAP initially improved with steroids, Tamiflu, antibiotics and nebulizers.  Patient recovered and able to get close to her baseline of 4 L nasal cannula, however starting 1/2, started having oxygen desaturations quiring high flow to be reinstated and oxygen titrated up as high as 12 L high flow nasal cannula.  ABG checked and patient found to be with significant acute respiratory acidosis and metabolic alkalosis.  Placed on BiPAP and started on Diamox.  Patient a bit more awake.  By 1/6 morning, wanted to be off of BiPAP and stay off.  ABG notes mild improvement in pCO2, down to 80> next day 1/7: She has been much more alert awake, pCO2 further down to 77.  Assessment and Plan: Acute on chronic respiratory failure with hypoxemia and hypercapnia. Severe mucous plug with a collapsing of right middle and lower lobe. POA Likely aspiration pneumonia. Influenza A. Reviewed CT scan from admission, reviewed images.  Patient had large amount of fluid inside the trachea, causing collapsed lungs.  The most likely explanation is aspiration.  Patient still has significant amount of mucus production, could not cough up.  I have started scheduled DuoNebs 4 times a day, in addition to Mucomyst already started.    Initially, patient procalcitonin level is not elevated, this appears to be chemical pneumonia.  Patient condition does not seem to be improving.  I will add a Unasyn for aspiration pneumonia.  Patient has been refusing blood draw, with the nurse and the patient, will have the days prior to draw, will check procalcitonin level.  Patient still has significant shortness of breath at nighttime, prior BNP was not elevated, not appear to be secondary to volume overload. Patient also has significant CO2 retention,  this could be due to aspiration, but most likely patient has obstructive sleep apnea.  Patient refused to repeat of ABG.  Will continue oxygen treatment.  Recent chest x-ray reported a moderate right-sided pleural effusion, I reviewed the images compared to the original chest x-ray, also compared to CT scan, this does not appear to be pleural effusion, most likely this is collapsed lung. Patient condition appears to be critical, high risk of deterioration.  Addendum: patient could not tolerate PCN, abx changed to rocephin and flagyl   Chronic diastolic congestive heart failure Pulmonary hypertension. History of PE. Patient is on therapeutic Lovenox, does not seem to have any volume overload.   History of stroke. Severe debility with bedbound status. Will transfer back to SNF when medically stable.   Morbid obesity. Diet and exercise as tolerated.        Subjective:  Patient is a very sleepy today, has a poor appetite.  Still on 5 L oxygen.  Physical Exam: Vitals:   04/17/22 0408 04/17/22 0804 04/17/22 0832 04/17/22 1153  BP: 118/85  96/71  99/63  Pulse: 76 (!) 116 98 (!) 126  Resp: 16 12  14   Temp: 99.3 F (37.4 C) 98.2 F (36.8 C)  98.3 F (36.8 C)  TempSrc: Oral Oral  Oral  SpO2: (!) 85% (!) 86% 91% (!) 89%  Weight:      Height:       General exam: Appears calm and comfortable, morbid obese. Respiratory system: Significant decreased breathing sounds. Respiratory  effort normal. Cardiovascular system: S1 & S2 heard, RRR. No JVD, murmurs, rubs, gallops or clicks. No pedal edema. Gastrointestinal system: Abdomen is nondistended, soft and nontender. No organomegaly or masses felt. Normal bowel sounds heard. Central nervous system: Drowsy and oriented. No focal neurological deficits. Extremities: Symmetric 5 x 5 power. Skin: No rashes, lesions or ulcers   Data Reviewed:  New labs today.  Family Communication: None  Disposition: Status is: Inpatient Remains inpatient appropriate because: Severity of disease, high risk of deterioration.  Planned Discharge Destination: Skilled nursing facility    Time spent: 50 minutes  Author: Sharen Hones, MD 04/17/2022 2:39 PM  For on call review [email or web address].

## 2022-04-17 NOTE — Progress Notes (Signed)
Palliative:  HPI: 52 y.o. female  with past medical history of chronic respiratory failure (2 L nasal cannula at baseline), history of CVA with residual left-sided weakness, pulmonary hypertension, multiple PEs, morbid obesity, asthma, and anxietyadmitted on 04/01/2022 with SOB.  Patient tested positive for influenza A.  CTA on 12/27 does not reveal any acute PEs but patient has secretions layering in the trachea extending into the bronchus intermedius with complete right middle and lower lobe collapse. Patient is being treated with IV steroids, Tamiflu, IV antibiotics, bronchodilators, and aggressive pulmonary hygiene. PMT was consulted to discuss goals of care.   I have reviewed records and note ongoing concerns for respiratory status. She has underlying poor functional status and inability to clear her secretions making improvement unlikely. I spoke with Sharon Black and she shares that she is tired because she was unable to sleep due to pain. I speak with her about her status and goals. She tells me clearly that she wants to be comfortable. She tells me that she desires medications to provide her with comfort recognizing her quality of life is poor and not anticipated to improve. She acknowledges the side effects of these medications and is accepting. She tells me that she has made her peace with dying. She also expresses that she is not interested in using BiPAP even if this can prolong her life. We discussed hospice care and how they can support her and provide more support than palliative care and she agrees with hospice support to follow upon return to ensure her comfort and symptom management.   All questions/concerns addressed. Emotional support provided. Updated Dr. Roosevelt Locks and Crane.   Exam: Easily awakens. Interactive and oriented. Insightful into her poor health condition and poor prognosis. No distress. Breathing regular, unlabored. +Congested cough with some residual labored breathing. Significant  secretions and unable to manage (will lead to ongoing aspiration and respiratory decline). Generalized weakness and fatigue.   Plan: - DNR in place - Desired comfort focused care - Continue medications and po antibiotics as indicated upon discharge - Hospice to follow at facility  45 min  Vinie Sill, NP Palliative Medicine Team Pager [phone] (Please see [email or web address] for schedule) Team Phone [phone]    Greater than 50%  of this time was spent counseling and coordinating care related to the above assessment and plan

## 2022-04-18 DIAGNOSIS — I69352 Hemiplegia and hemiparesis following cerebral infarction affecting left dominant side: Secondary | ICD-10-CM | POA: Diagnosis not present

## 2022-04-18 DIAGNOSIS — I272 Pulmonary hypertension, unspecified: Secondary | ICD-10-CM | POA: Diagnosis not present

## 2022-04-18 DIAGNOSIS — J690 Pneumonitis due to inhalation of food and vomit: Secondary | ICD-10-CM | POA: Diagnosis not present

## 2022-04-18 DIAGNOSIS — J9621 Acute and chronic respiratory failure with hypoxia: Secondary | ICD-10-CM | POA: Diagnosis not present

## 2022-04-18 LAB — BASIC METABOLIC PANEL
Anion gap: 7 (ref 5–15)
BUN: 13 mg/dL (ref 6–20)
CO2: 32 mmol/L (ref 22–32)
Calcium: 8.7 mg/dL — ABNORMAL LOW (ref 8.9–10.3)
Chloride: 98 mmol/L (ref 98–111)
Creatinine, Ser: <0.3 mg/dL — ABNORMAL LOW (ref 0.44–1.00)
Glucose, Bld: 139 mg/dL — ABNORMAL HIGH (ref 70–99)
Potassium: 3.8 mmol/L (ref 3.5–5.1)
Sodium: 137 mmol/L (ref 135–145)

## 2022-04-18 LAB — CBC
HCT: 42 % (ref 36.0–46.0)
Hemoglobin: 12.3 g/dL (ref 12.0–15.0)
MCH: 26.2 pg (ref 26.0–34.0)
MCHC: 29.3 g/dL — ABNORMAL LOW (ref 30.0–36.0)
MCV: 89.4 fL (ref 80.0–100.0)
Platelets: 165 10*3/uL (ref 150–400)
RBC: 4.7 MIL/uL (ref 3.87–5.11)
RDW: 16.4 % — ABNORMAL HIGH (ref 11.5–15.5)
WBC: 8.7 10*3/uL (ref 4.0–10.5)
nRBC: 0 % (ref 0.0–0.2)

## 2022-04-18 LAB — MAGNESIUM: Magnesium: 2.5 mg/dL — ABNORMAL HIGH (ref 1.7–2.4)

## 2022-04-18 MED ORDER — TRAMADOL HCL 50 MG PO TABS: 50.0000 mg | ORAL_TABLET | Freq: Three times a day (TID) | ORAL | 0 refills | Status: DC | PRN

## 2022-04-18 MED ORDER — MORPHINE SULFATE 20 MG/5ML PO SOLN: 2.5000 mg | ORAL | 0 refills | Status: DC | PRN

## 2022-04-18 MED ORDER — LOPERAMIDE HCL 2 MG PO CAPS: 2.0000 mg | ORAL_CAPSULE | ORAL | 0 refills | Status: DC | PRN

## 2022-04-18 NOTE — Progress Notes (Signed)
[facility] ([facility])    If applicable, please send signed and completed DNR with patient/family upon discharge. Please provide prescriptions at discharge as needed to ensure ongoing symptom management and a transport packet.   [facility] information and contact numbers given to family and above information shared with TOC.    Please call with any questions/concerns.    Thank you for the opportunity to participate in this patient's care   Shania Daniel, MSW [facility] Liaison  [phone]  

## 2022-04-18 NOTE — Discharge Summary (Signed)
Physician Discharge Summary   Patient: Sharon Black MRN: [id number] DOB: March 20, 1971  Admit date:     04/01/2022  Discharge date: 04/18/22  Discharge Physician: Sharen Hones   PCP: Hal Morales, DO   Recommendations at discharge:      Discharge Diagnoses: Principal Problem:   Acute on chronic respiratory failure with hypoxia and hypercapnia ([place]) Active Problems:   Pulmonary hypertension ([place])   History of CVA with residual deficit   Depression   Morbid obesity ([place])   History of pulmonary embolism   Hypokalemia   Chronic pain disorder   Acute metabolic encephalopathy   Aspiration pneumonia of right lower lobe due to gastric secretions (HCC)   Influenza and pneumonia   Hemiparesis of left dominant side as late effect of cerebral infarction [facility])  Resolved Problems:   Influenza A Hypokalemia. Hypomagnesemia.  Hospital Course: 52 year old morbidly obese female with chronic respiratory failure on 5 L of nasal cannula oxygen at baseline due to longstanding pulmonary hypertension as well as a history of morbid obesity, multiple PEs and CVA with residual weakness who presented to the ED on 12/26 for shortness of breath and found to be in acute resp distress from influenza A.  CTA on 12/27 does not show any acute PE but has secretions layering in the trachea extending into the bronchus intermedius with complete right middle and lower lobe collapse.  Patient placed on BiPAP initially improved with steroids, Tamiflu, antibiotics and nebulizers.  Patient recovered and able to get close to her baseline of 4 L nasal cannula, however starting 1/2, started having oxygen desaturations quiring high flow to be reinstated and oxygen titrated up as high as 12 L high flow nasal cannula.  ABG checked and patient found to be with significant acute respiratory acidosis and metabolic alkalosis.  Placed on BiPAP and started on Diamox.  Patient a bit more awake.  By 1/6 morning, wanted to be off  of BiPAP and stay off.  ABG notes mild improvement in pCO2, down to 80> next day 1/7: She has been much more alert awake, pCO2 further down to 77. Patient condition does not improve, she could not clear her airway.  She was seen by palliative care, recommend hospice care.  At this point, she will be transferred to nursing with hospice care, her life expectancy is probably less than 4 weeks. Assessment and Plan: Acute on chronic respiratory failure with hypoxemia and hypercapnia. Severe mucous plug with a collapsing of right middle and lower lobe. POA Likely aspiration pneumonia. Influenza A. Reviewed CT scan from admission, reviewed images.  Patient had large amount of fluid inside the trachea, causing collapsed lungs.  The most likely explanation is aspiration.  Patient still has significant amount of mucus production, could not cough up.  I have started scheduled DuoNebs 4 times a day, in addition to Mucomyst already started.   Initially, patient procalcitonin level is not elevated, this appears to be chemical pneumonia.  Patient condition does not seem to be improving, I added Rocephin and flagyl for aspiration pneumonia.     Patient still has significant shortness of breath at nighttime, prior BNP was not elevated, not appear to be secondary to volume overload. Patient also has significant CO2 retention,  this could be due to aspiration, but most likely patient has obstructive sleep apnea.  Patient refused to repeat of ABG.     Recent chest x-ray reported a moderate right-sided pleural effusion, I reviewed the images compared to the original chest x-ray, also compared  to CT scan, this does not appear to be pleural effusion, most likely this is collapsed lung.  Patient condition does not seem to be improving, she is seen by palliative care, she no longer desires any active treatment.  At this point, she will be discharged to nursing home with hospice care.   Chronic diastolic congestive heart  failure Pulmonary hypertension. History of PE. No need for additional treatment due to hospice care.   History of stroke with left-sided hemiparesis. Severe debility with bedbound status. Morbid obesity. Follow-up with hospice.       Consultants: Palliative care Procedures performed: None  Disposition: Hospice care Diet recommendation:  Discharge Diet Orders (From admission, onward)     Start     Ordered   04/18/22 0000  Diet - low sodium heart healthy        04/18/22 1057           Regular diet DISCHARGE MEDICATION: Allergies as of 04/18/2022       Reactions   Penicillins Hives, Diarrhea, Nausea And Vomiting   Pt reported   Sulphamethoxydiazine Diarrhea   Tetracyclines & Related Diarrhea        Medication List     STOP taking these medications    Aspirin Low Dose 81 MG chewable tablet Generic drug: aspirin   atorvastatin 40 MG tablet Commonly known as: LIPITOR   calcium carbonate 500 MG chewable tablet Commonly known as: TUMS - dosed in mg elemental calcium   enoxaparin 60 MG/0.6ML injection Commonly known as: LOVENOX   furosemide 40 MG tablet Commonly known as: Lasix   ibuprofen 200 MG tablet Commonly known as: ADVIL   potassium chloride SA 20 MEQ tablet Commonly known as: KLOR-CON M   SOLU-Medrol (PF) 40 MG injection ACT-O-VIAL Generic drug: methylPREDNISolone sodium succinate       TAKE these medications    acetaminophen 650 MG CR tablet Commonly known as: TYLENOL Take 650 mg by mouth every 8 (eight) hours as needed for pain.   diclofenac Sodium 1 % Gel Commonly known as: VOLTAREN Apply 2 g topically 4 (four) times daily.   divalproex 125 MG capsule Commonly known as: DEPAKOTE SPRINKLE Take 250 mg by mouth daily at 12 noon.   divalproex 500 MG DR tablet Commonly known as: DEPAKOTE Take 500 mg by mouth 2 (two) times daily. 0800 & 1600   docusate sodium 100 MG capsule Commonly known as: COLACE Take 100 mg by mouth daily.    Eucerin Skin Calming Crea Apply topically.   fexofenadine 60 MG tablet Commonly known as: ALLEGRA Take 60 mg by mouth daily.   fluticasone 50 MCG/ACT nasal spray Commonly known as: FLONASE Place 1 spray into both nostrils daily.   ipratropium-albuterol 0.5-2.5 (3) MG/3ML Soln Commonly known as: DUONEB Take 3 mLs by nebulization every 12 (twelve) hours as needed.   lidocaine 5 % Commonly known as: LIDODERM Place 1 patch onto the skin daily. Remove & Discard patch within 12 hours or as directed by MD   loperamide 2 MG capsule Commonly known as: IMODIUM Take 1 capsule (2 mg total) by mouth as needed for diarrhea or loose stools.   morphine 20 MG/5ML solution Take 0.6 mLs (2.4 mg total) by mouth every 2 (two) hours as needed (for air hunger).   Nyamyc powder Generic drug: nystatin Apply 1 Application topically 2 (two) times daily.   ondansetron 4 MG tablet Commonly known as: ZOFRAN Take 4 mg by mouth every 6 (six) hours as needed.  pantoprazole 20 MG tablet Commonly known as: PROTONIX Take 20 mg by mouth daily.   polyethylene glycol 17 g packet Commonly known as: MIRALAX / GLYCOLAX Take 17 g by mouth daily as needed.   pregabalin 50 MG capsule Commonly known as: LYRICA Take 50 mg by mouth 2 (two) times daily. 0800 & 1600   Refresh Liquigel 1 % Gel Generic drug: Carboxymethylcellulose Sodium Apply 1 drop to eye in the morning and at bedtime. 1000 & 1800   risperiDONE 0.5 MG tablet Commonly known as: RISPERDAL Take 0.5 mg by mouth 2 (two) times daily. 0800 & 1600   sertraline 100 MG tablet Commonly known as: ZOLOFT Take 100 mg by mouth in the morning.   traMADol 50 MG tablet Commonly known as: ULTRAM Take 1 tablet (50 mg total) by mouth every 8 (eight) hours as needed.               Discharge Care Instructions  (From admission, onward)           Start     Ordered   04/18/22 0000  Discharge wound care:       Comments: Follow with RN and  hospice   04/18/22 1057            Follow-up Information     [facility].   Specialty: Hospice and Palliative Medicine Contact information: [place] [address] [phone]                Discharge Exam: Danley Danker Weights   04/01/22 1659  Weight: (!) 140 kg   General exam: Appears calm and comfortable, morbid obese. Respiratory system: Decreased breath sounds with rhonchi. Respiratory effort normal. Cardiovascular system: S1 & S2 heard, RRR. No JVD, murmurs, rubs, gallops or clicks. No pedal edema. Gastrointestinal system: Abdomen is nondistended, soft and nontender. No organomegaly or masses felt. Normal bowel sounds heard. Central nervous system: Alert and oriented x3. No focal neurological deficits. Extremities: Symmetric 5 x 5 power. Skin: No rashes, lesions or ulcers Psychiatry: Flat affect   Condition at discharge: poor  The results of significant diagnostics from this hospitalization (including imaging, microbiology, ancillary and laboratory) are listed below for reference.   Imaging Studies: DG Chest Port 1 View  Result Date: 04/09/2022 CLINICAL DATA:  Acute on chronic respiratory failure with hypoxia. EXAM: PORTABLE CHEST 1 VIEW COMPARISON:  April 01, 2022. FINDINGS: Stable cardiomediastinal silhouette. Moderate size right pleural effusion is again noted. Sternotomy wires are noted. Mild left basilar subsegmental atelectasis is noted. Bony thorax is unremarkable. IMPRESSION: Moderate size right pleural effusion is again noted. Mild left basilar subsegmental atelectasis. Electronically Signed   By: Marijo Conception M.D.   On: 04/09/2022 15:36   [place] CHEST (PLEURAL EFFUSION)  Result Date: 04/05/2022 CLINICAL DATA:  52 year old female with suspicion for right pleural effusion. EXAM: CHEST ULTRASOUND COMPARISON:  04/01/2022, 04/02/2022 FINDINGS: No evidence of bilateral pleural effusion. IMPRESSION: No evidence of  bilateral pleural effusion. No indication for thoracentesis. Ruthann Cancer, MD [facility] Radiology Electronically Signed   By: Ruthann Cancer M.D.   On: 04/05/2022 14:52   CT Angio Chest Pulmonary Embolism (PE) W or WO Contrast  Result Date: 04/02/2022 CLINICAL DATA:  Shortness of breath found to have large right pleural effusion EXAM: CT ANGIOGRAPHY CHEST WITH CONTRAST TECHNIQUE: Multidetector CT imaging of the chest was performed using the standard protocol during bolus administration of intravenous contrast. Multiplanar CT image reconstructions and  MIPs were obtained to evaluate the vascular anatomy. RADIATION DOSE REDUCTION: This exam was performed according to the departmental dose-optimization program which includes automated exposure control, adjustment of the mA and/or kV according to patient size and/or use of iterative reconstruction technique. CONTRAST:  77mL OMNIPAQUE IOHEXOL 350 MG/ML SOLN COMPARISON:  Chest radiograph dated 04/01/2022, CT chest dated 07/31/2021 FINDINGS: Cardiovascular: The study is adequate quality for the evaluation of pulmonary embolism to the proximal subsegmental levels. There are no filling defects in the central, lobar, segmental or proximal subsegmental pulmonary artery branches to suggest acute pulmonary embolism. Main pulmonary artery measures 3.6 cm. Normal heart size. No significant pericardial fluid/thickening. Mediastinum/Nodes: Partially imaged thyroid gland without nodules meeting criteria for imaging follow-up by size. Normal esophagus. Left supraclavicular lymph node measures 10 mm (4:6). Lungs/Pleura: The central airways are patent. Layering secretions within the trachea extending into the bronchus intermedius. Complete right middle and lower lobe collapse. Subsegmental atelectasis of the left lower lobe. Diffuse bilateral ground-glass opacities in the right-greater-than-left upper lobes. No pneumothorax. Trace  right pleural effusion. Upper abdomen: Enlarged spleen in the AP dimension measures 15.9 cm, similar to 07/31/2021. Musculoskeletal: Median sternotomy wires are nondisplaced. No acute or abnormal lytic or blastic osseous lesions. Review of the MIP images confirms the above findings. IMPRESSION: 1. No evidence of acute pulmonary embolism. 2. Layering secretions in the trachea extending into the bronchus intermedius with complete right middle and lower lobe collapse. Subsegmental atelectasis of the left lower lobe. 3. Diffuse bilateral ground-glass opacities in the right-greater-than-left upper lobes, which may be infectious/inflammatory. 4. Enlarged main pulmonary artery measuring up to 3.6 cm, suggestive of pulmonary arterial hypertension. 5. Trace right pleural effusion. 6. Left supraclavicular lymph node measures 10 mm, likely reactive. 7. Splenomegaly, similar to 07/31/2021. Electronically Signed   By: Darrin Nipper M.D.   On: 04/02/2022 09:18   DG Chest Portable 1 View  Result Date: 04/01/2022 CLINICAL DATA:  Dyspnea EXAM: PORTABLE CHEST 1 VIEW COMPARISON:  07/31/2021 FINDINGS: Lung volumes are small. Moderate right pleural effusion is present with associated right basilar atelectasis. Superimposed perihilar interstitial pulmonary infiltrate is present suggesting superimposed mild interstitial pulmonary edema, possibly cardiogenic in nature. No pneumothorax. No pleural effusion on the left. Cardiac size is mildly enlarged. Median sternotomy has been performed. No acute bone abnormality. IMPRESSION: 1. Moderate right pleural effusion with associated right basilar atelectasis. 2. Stable cardiomegaly.  Mild cardiogenic failure. 3. Pulmonary hypoinflation. Electronically Signed   By: Fidela Salisbury M.D.   On: 04/01/2022 17:53    Microbiology: Results for orders placed or performed during the hospital encounter of 04/01/22  Resp panel by RT-PCR (RSV, Flu A&B, Covid) Anterior Nasal Swab     Status: Abnormal    Collection Time: 04/01/22  5:25 PM   Specimen: Anterior Nasal Swab  Result Value Ref Range Status   SARS Coronavirus 2 by RT PCR NEGATIVE NEGATIVE Final    Comment: (NOTE) SARS-CoV-2 target nucleic acids are NOT DETECTED.  The SARS-CoV-2 RNA is generally detectable in upper respiratory specimens during the acute phase of infection. The lowest concentration of SARS-CoV-2 viral copies this assay can detect is 138 copies/mL. A negative result does not preclude SARS-Cov-2 infection and should not be used as the sole basis for treatment or other patient management decisions. A negative result may occur with  improper specimen collection/handling, submission of specimen other than nasopharyngeal swab, presence of viral mutation(s) within the areas targeted by this assay, and inadequate number of viral copies(<138 copies/mL). A negative  result must be combined with clinical observations, patient history, and epidemiological information. The expected result is Negative.  Fact Sheet for Patients:  EntrepreneurPulse.com.au  Fact Sheet for Healthcare Providers:  IncredibleEmployment.be  This test is no t yet approved or cleared by the [place] FDA and  has been authorized for detection and/or diagnosis of SARS-CoV-2 by FDA under an Emergency Use Authorization (EUA). This EUA will remain  in effect (meaning this test can be used) for the duration of the COVID-19 declaration under Section 564(b)(1) of the Act, 21 U.S.C.section 360bbb-3(b)(1), unless the authorization is terminated  or revoked sooner.       Influenza A by PCR POSITIVE (A) NEGATIVE Final   Influenza B by PCR NEGATIVE NEGATIVE Final    Comment: (NOTE) The Xpert Xpress SARS-CoV-2/FLU/RSV plus assay is intended as an aid in the diagnosis of influenza from Nasopharyngeal swab specimens and should not be used as a sole basis for treatment. Nasal washings and aspirates are unacceptable for  Xpert Xpress SARS-CoV-2/FLU/RSV testing.  Fact Sheet for Patients: EntrepreneurPulse.com.au  Fact Sheet for Healthcare Providers: IncredibleEmployment.be  This test is not yet approved or cleared by the [place] FDA and has been authorized for detection and/or diagnosis of SARS-CoV-2 by FDA under an Emergency Use Authorization (EUA). This EUA will remain in effect (meaning this test can be used) for the duration of the COVID-19 declaration under Section 564(b)(1) of the Act, 21 U.S.C. section 360bbb-3(b)(1), unless the authorization is terminated or revoked.     Resp Syncytial Virus by PCR NEGATIVE NEGATIVE Final    Comment: (NOTE) Fact Sheet for Patients: EntrepreneurPulse.com.au  Fact Sheet for Healthcare Providers: IncredibleEmployment.be  This test is not yet approved or cleared by the [place] FDA and has been authorized for detection and/or diagnosis of SARS-CoV-2 by FDA under an Emergency Use Authorization (EUA). This EUA will remain in effect (meaning this test can be used) for the duration of the COVID-19 declaration under Section 564(b)(1) of the Act, 21 U.S.C. section 360bbb-3(b)(1), unless the authorization is terminated or revoked.  Performed at [facility], [address] [place], [place] [address]   Blood culture (routine x 2)     Status: None   Collection Time: 04/01/22  6:47 PM   Specimen: BLOOD  Result Value Ref Range Status   Specimen Description BLOOD BLOOD RIGHT FOREARM  Final   Special Requests   Final    BOTTLES DRAWN AEROBIC AND ANAEROBIC Blood Culture adequate volume   Culture   Final    NO GROWTH 5 DAYS Performed at [facility], [address], [place], [place] [address]    Report Status 04/06/2022 FINAL  Final  Blood culture (routine x 2)     Status: None   Collection Time: 04/01/22  6:48 PM   Specimen: BLOOD  Result Value Ref Range  Status   Specimen Description BLOOD BLOOD RIGHT WRIST  Final   Special Requests   Final    BOTTLES DRAWN AEROBIC AND ANAEROBIC Blood Culture adequate volume   Culture   Final    NO GROWTH 5 DAYS Performed at [facility], [address], [place], [place] [address]    Report Status 04/06/2022 FINAL  Final  MRSA Next Gen by PCR, Nasal     Status: Abnormal   Collection Time: 04/05/22  6:38 AM   Specimen: Nasal Mucosa; Nasal Swab  Result Value Ref Range Status   MRSA by PCR Next Gen DETECTED (A) NOT DETECTED Final  Comment: RESULT CALLED TO, READ BACK BY AND VERIFIED WITH: LESLIE MITTS @0845  04/05/22 MJU (NOTE) The GeneXpert MRSA Assay (FDA approved for NASAL specimens only), is one component of a comprehensive MRSA colonization surveillance program. It is not intended to diagnose MRSA infection nor to guide or monitor treatment for MRSA infections. Test performance is not FDA approved in patients less than 54 years old. Performed at [facility], [address] [place], [place]     Labs: CBC: Recent Labs  Lab 04/15/22 1431 04/17/22 1901 04/18/22 0648  WBC 6.7 7.2 8.7  HGB 11.5* 11.4* 12.3  HCT 39.5 39.3 42.0  MCV 88.6 88.3 89.4  PLT 235 171 165   Basic Metabolic Panel: Recent Labs  Lab 04/12/22 0711 04/13/22 0552 04/15/22 1431 04/17/22 1901 04/18/22 0648  NA 140 141 137 136 137  K 3.5 3.3* 4.4 3.6 3.8  CL 100 102 98 96* 98  CO2 31 30 30 31  32  GLUCOSE 98 94 94 136* 139*  BUN 13 14 16 13 13   CREATININE 0.38* 0.37* 0.36* 0.40* <0.30*  CALCIUM 8.7* 8.6* 8.8* 8.2* 8.7*  MG  --   --  2.1 1.5* 2.5*   Liver Function Tests: No results for input(s): "AST", "ALT", "ALKPHOS", "BILITOT", "PROT", "ALBUMIN" in the last 168 hours. CBG: No results for input(s): "GLUCAP" in the last 168 hours.  Discharge time spent: greater than 30 minutes.  Signed: 06/17/22, MD [facility] 04/18/2022

## 2022-04-18 NOTE — Plan of Care (Signed)

## 2022-04-18 NOTE — TOC Transition Note (Signed)
Transition of Care [facility]) - CM/SW Discharge Note   Patient Details  Name: Kymberly Blomberg MRN: [id number] Date of Birth: 03/22/1971  Transition of Care [facility]) CM/SW Contact:  Tiburcio Bash, LCSW Phone Number: 04/18/2022, 11:38 AM   Clinical Narrative:     Patient will DC to: [place] Anticipated DC date: 04/18/22 Transport by: Johnanna Schneiders  Per MD patient ready for DC to [facility] with [facility] . RN, patient, patient's family, and facility notified of DC. Discharge Summary sent to facility. RN given number for report   [phone]. DC packet on chart. Ambulance transport requested for patient.  CSW signing off.  Pricilla Riffle, LCSW    Final next level of care: [place] (with [facility]) Barriers to Discharge: No Barriers Identified   Patient Goals and CMS Choice CMS Medicare.gov Compare Post Acute Care list provided to:: Patient Choice offered to / list presented to : Patient  Discharge Placement                Patient chooses bed at: [facility] Patient to be transferred to facility by: [facility]   Patient and family notified of of transfer: 04/18/22  Discharge Plan and Services Additional resources added to the After Visit Summary for                                       Social Determinants of Health (SDOH) Interventions     Readmission Risk Interventions     No data to display

## 2023-03-08 DEATH — deceased

## 2023-04-17 IMAGING — CT CT CHEST W/O CM
2 of 4 series · 15 of 36 positions shown, 18 images · non-contrast
Comparison: Chest radiograph of earlier today

CLINICAL DATA: Shortness of breath.  CVA.  Bed-bound patient.



[Series 3: chest wo · axial · 0.59mm/px · z∈[+1391,+1621]mm · 12 of 137 slices shown, 15 images]
[im 11/137  mediastinal]
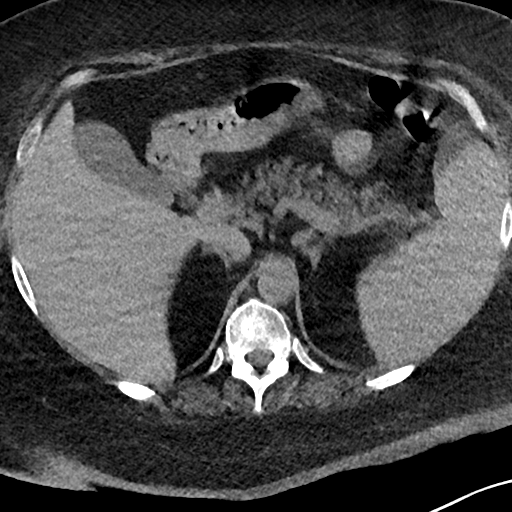
[im 11/137  lung]
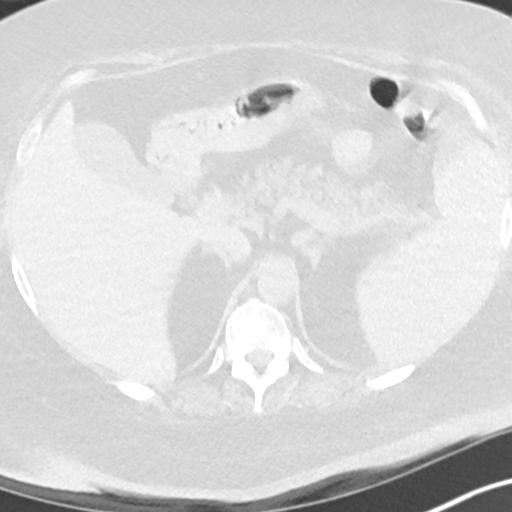
[im 21/137  lung]
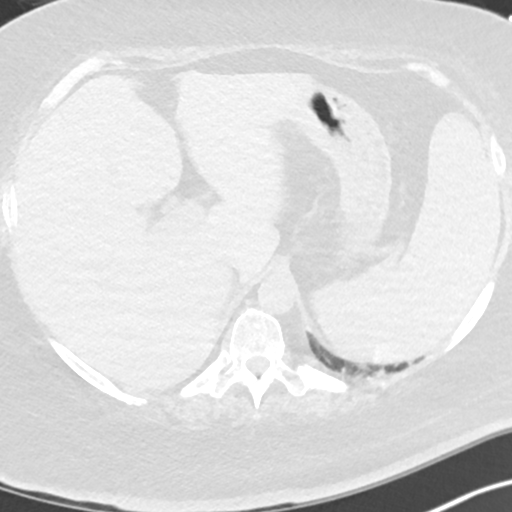
[im 32/137  lung]
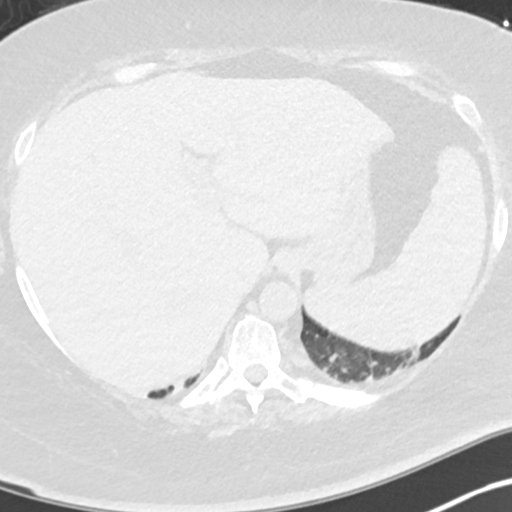
[im 42/137  lung]
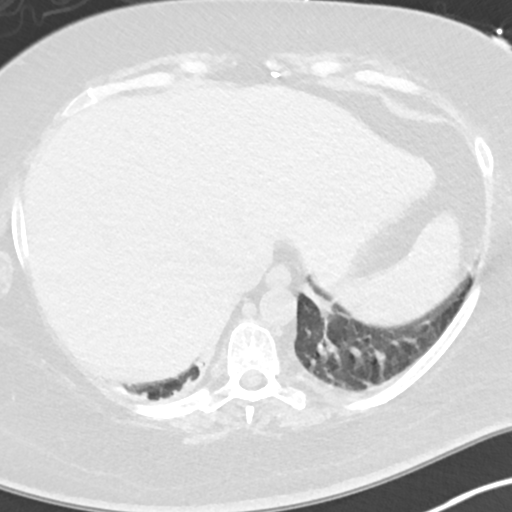
[im 53/137  mediastinal]
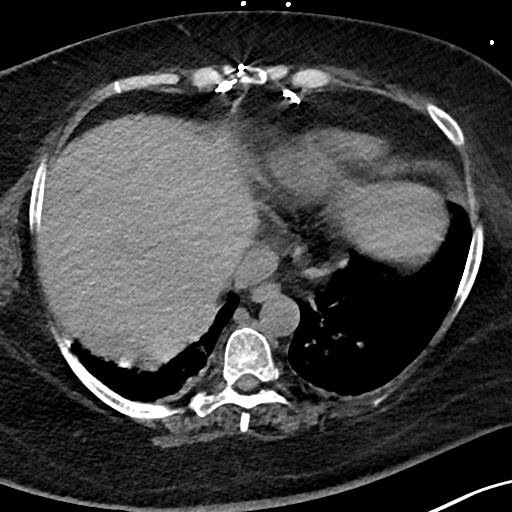
[im 53/137  lung]
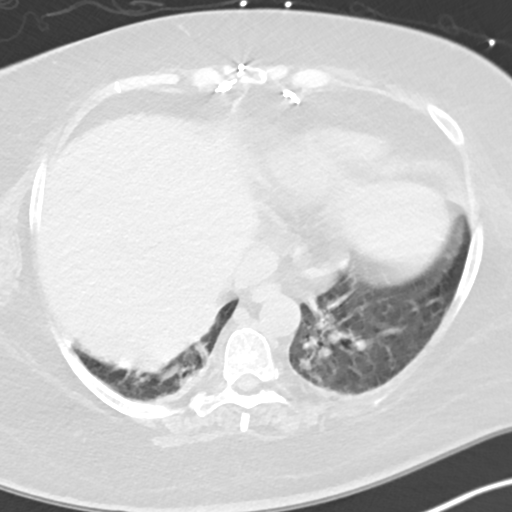
[im 63/137  lung]
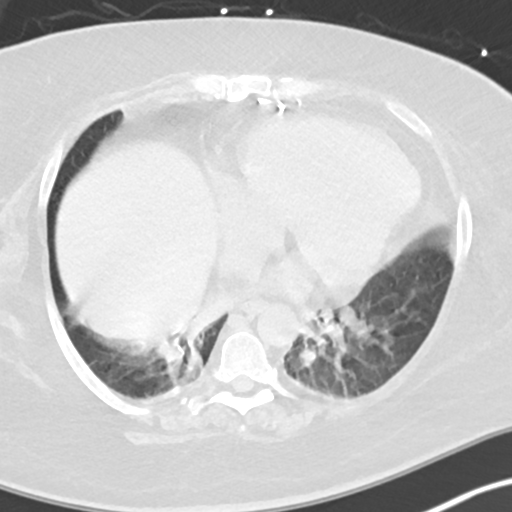
[im 74/137  lung]
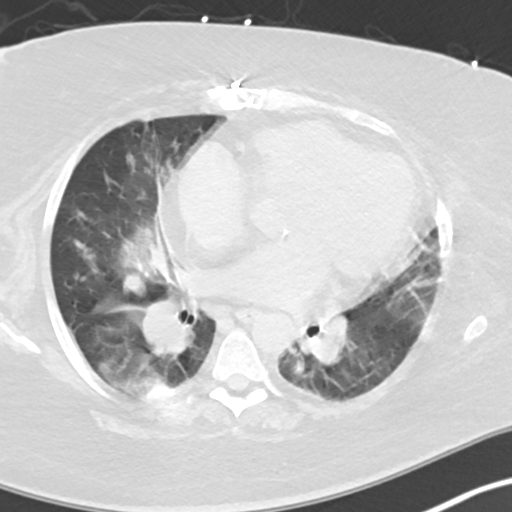
[im 84/137  lung]
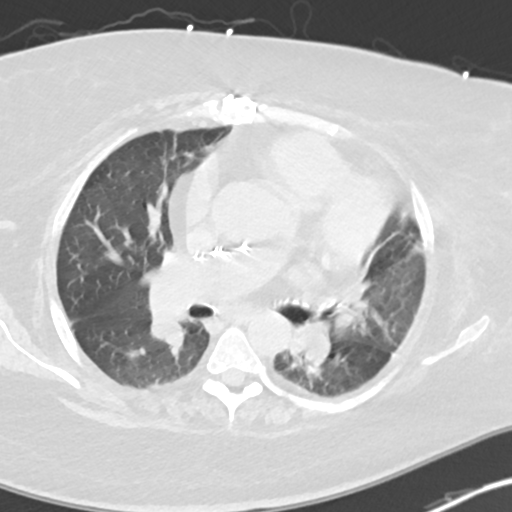
[im 95/137  mediastinal]
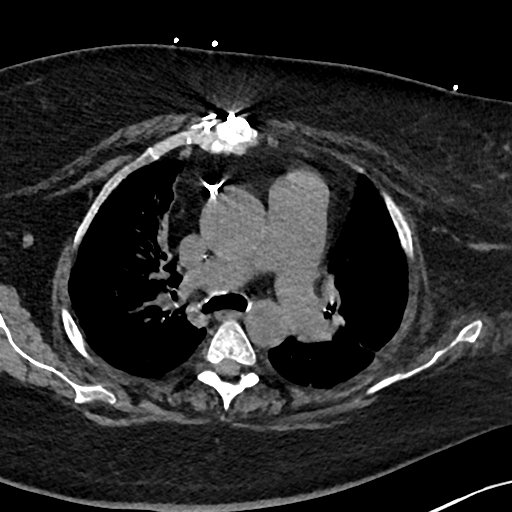
[im 95/137  lung]
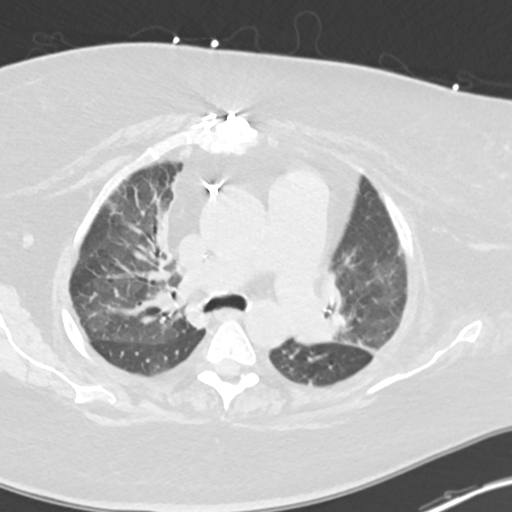
[im 105/137  lung]
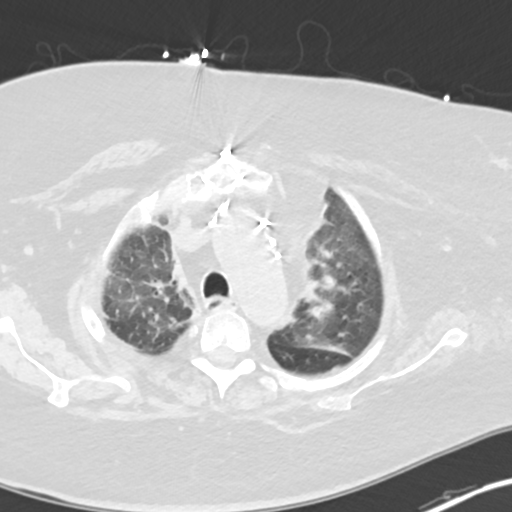
[im 116/137  lung]
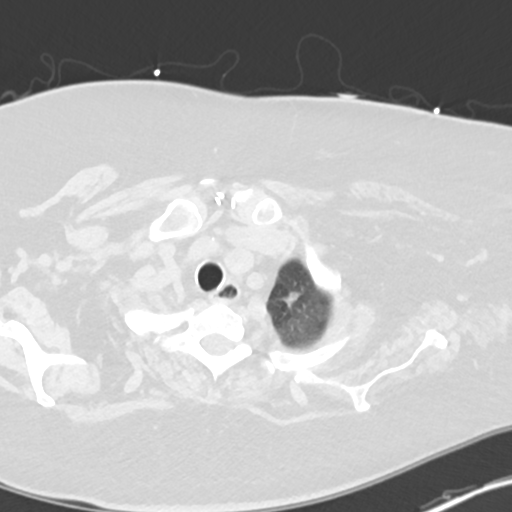
[im 126/137  lung]
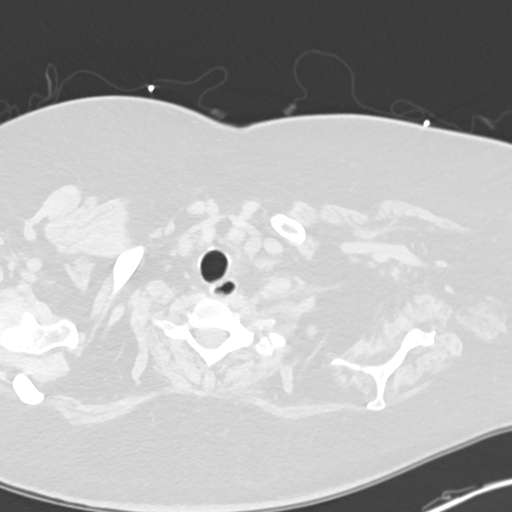

[Series 6: cor · coronal · 0.73mm/px · 3 of 157 slices shown]
[im 32/157  lung]
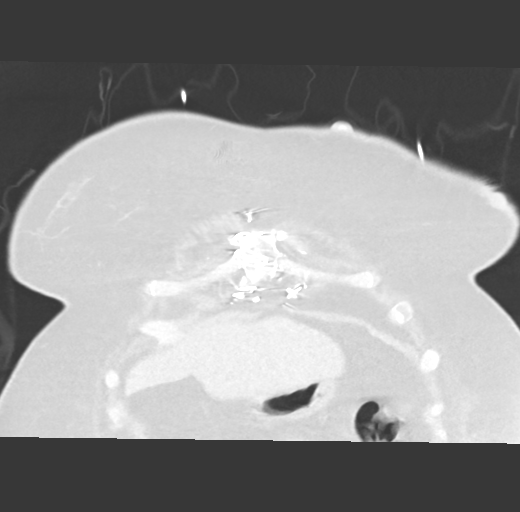
[im 63/157  lung]
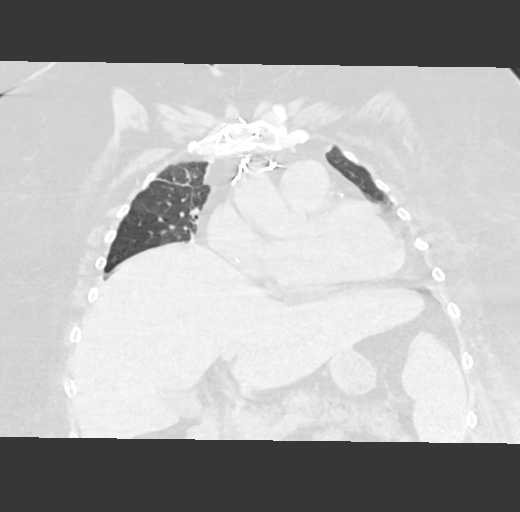
[im 94/157  lung]
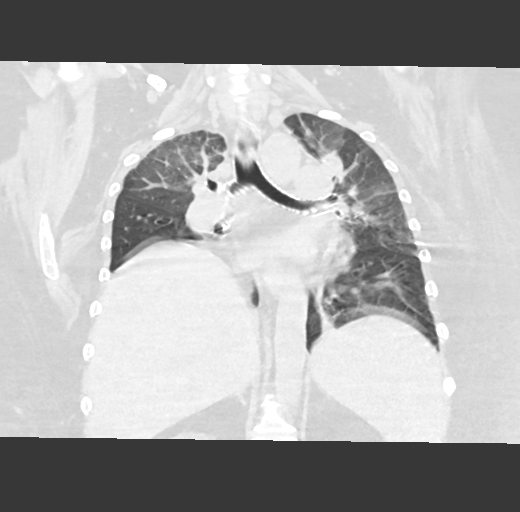

[15 of 36 positions shown; findings below may reference images not displayed]

FINDINGS: Cardiovascular: Mild motion degradation, most significant in the
lung bases. Median sternotomy for prior CABG. Aortic
atherosclerosis. Tortuous thoracic aorta. Upper normal ascending
aortic caliber including at 3.9 cm on coronal image 70. Moderate
cardiomegaly, without pericardial effusion.

Pulmonary artery enlargement, outflow tract 3.3 cm

Mediastinum/Nodes: No mediastinal or definite hilar adenopathy,
given limitations of unenhanced CT.

Lungs/Pleura: No pleural fluid. Hypoventilation, resulting in areas
of mosaic attenuation likely related to air trapping. Somewhat more
focal ground-glass in the lung apices with mild septal thickening at
the right apex, including on 32/4.

No lobar consolidation.

Upper Abdomen: Normal imaged portions of the liver, spleen, stomach,
pancreas, gallbladder, adrenal glands, kidneys.

Musculoskeletal: No acute osseous abnormality.
IMPRESSION: 1. Mildly motion degraded exam.
2. No evidence of pneumonia. Suspect pulmonary venous congestion,
primarily at the apices. Concurrent hypoventilation with areas of
air trapping.
3. Cardiomegaly.
4. Pulmonary artery enlargement suggests pulmonary arterial
hypertension.
5. Upper normal ascending aortic caliber, 3.9 cm.
6.  Aortic Atherosclerosis ([id number]).
